# Patient Record
Sex: Male | Born: 1970 | Race: White | Hispanic: No | Marital: Married
Health system: Midwestern US, Community
[De-identification: ages and names within clinical notes are randomized; demographics above are authoritative.]

---

## 2018-07-03 ENCOUNTER — Emergency Department: Admit: 2018-07-03 | Primary: Internal Medicine

## 2018-07-03 ENCOUNTER — Inpatient Hospital Stay: Admit: 2018-07-03 | Discharge: 2018-07-03 | Disposition: A | Discharge status: Home / Self Care

## 2018-07-03 DIAGNOSIS — J90 Pleural effusion, not elsewhere classified: Secondary | ICD-10-CM

## 2018-07-03 LAB — CBC WITH AUTO DIFFERENTIAL
Basophils %: 0.8 %
Basophils Absolute: 0.1 10*3/uL (ref 0.0–0.2)
Eosinophils %: 5.9 %
Eosinophils Absolute: 0.9 10*3/uL — ABNORMAL HIGH (ref 0.0–0.7)
Hematocrit: 38.7 % — ABNORMAL LOW (ref 42.0–52.0)
Hemoglobin: 12.7 g/dL — ABNORMAL LOW (ref 14.0–18.0)
Lymphocytes %: 18.8 %
Lymphocytes Absolute: 2.7 10*3/uL (ref 1.0–4.8)
MCH: 29.3 pg (ref 27.0–31.3)
MCHC: 32.8 % — ABNORMAL LOW (ref 33.0–37.0)
MCV: 89.3 fL (ref 80.0–100.0)
Monocytes %: 9.1 %
Monocytes Absolute: 1.3 10*3/uL — ABNORMAL HIGH (ref 0.2–0.8)
Neutrophils %: 65.4 %
Neutrophils Absolute: 9.4 10*3/uL — ABNORMAL HIGH (ref 1.4–6.5)
Platelets: 331 10*3/uL (ref 130–400)
RBC: 4.34 M/uL — ABNORMAL LOW (ref 4.70–6.10)
RDW: 13 % (ref 11.5–14.5)
WBC: 14.4 10*3/uL — ABNORMAL HIGH (ref 4.8–10.8)

## 2018-07-03 LAB — COMPREHENSIVE METABOLIC PANEL
ALT: 9 U/L (ref 0–41)
AST: 12 U/L (ref 0–40)
Albumin: 4 g/dL (ref 3.5–4.6)
Alkaline Phosphatase: 68 U/L (ref 35–104)
Anion Gap: 11 mEq/L (ref 9–15)
BUN: 18 mg/dL (ref 6–20)
CO2: 26 mEq/L (ref 20–31)
Calcium: 9.5 mg/dL (ref 8.5–9.9)
Chloride: 102 mEq/L (ref 95–107)
Creatinine: 0.69 mg/dL — ABNORMAL LOW (ref 0.70–1.20)
GFR African American: >60 (ref >60)
GFR Non-African American: >60 (ref >60)
Globulin: 3 g/dL (ref 2.3–3.5)
Glucose: 101 mg/dL — ABNORMAL HIGH (ref 70–99)
Potassium: 4.6 mEq/L (ref 3.4–4.9)
Sodium: 139 mEq/L (ref 135–144)
Total Bilirubin: 0.3 mg/dL (ref 0.2–0.7)
Total Protein: 7 g/dL (ref 6.3–8.0)

## 2018-07-03 LAB — EKG 12-LEAD
Atrial Rate: 72 {beats}/min
P Axis: 2 degrees
P-R Interval: 172 ms
Q-T Interval: 368 ms
QRS Duration: 86 ms
QTc Calculation (Bazett): 402 ms
R Axis: 15 degrees
T Axis: 16 degrees
Ventricular Rate: 72 {beats}/min

## 2018-07-03 LAB — URINE DRUG SCREEN
Amphetamine Screen, Urine: NEGATIVE (ref <1000)
Barbiturate Screen, Ur: NEGATIVE (ref <200)
Benzodiazepine Screen, Urine: POSITIVE — AB (ref <200)
Cannabinoid Scrn, Ur: NEGATIVE (ref <50)
Cocaine Metabolite Screen, Urine: POSITIVE — AB (ref <300)
Methadone Screen, Urine: NEGATIVE (ref <300)
Opiate Scrn, Ur: POSITIVE — AB (ref <300)
Oxycodone Urine: NEGATIVE (ref <100)
PCP Screen, Urine: NEGATIVE (ref <25)
Propoxyphene Scrn, Ur: NEGATIVE (ref <300)

## 2018-07-03 LAB — COVID-19: SARS-CoV-2, NAAT: NOT DETECTED

## 2018-07-03 LAB — POCT CREATININE: POC CREATININE WHOLE BLOOD: 0.7

## 2018-07-03 LAB — ETHANOL: Ethanol Lvl: <10 mg/dL

## 2018-07-03 LAB — TROPONIN: Troponin: <0.01 ng/mL (ref 0.000–0.010)

## 2018-07-03 MED ORDER — DEXTROSE 5 % IV SOLN ADMIXTURE
5 % | Freq: Once | INTRAVENOUS | Status: AC
Start: 2018-07-03 — End: 2018-07-03
  Administered 2018-07-03: 21:00:00 1 g via INTRAVENOUS

## 2018-07-03 MED ORDER — IOPAMIDOL 61 % IV SOLN
61 % | Freq: Once | INTRAVENOUS | Status: AC | PRN
Start: 2018-07-03 — End: 2018-07-03
  Administered 2018-07-03: 18:00:00 100 mL via INTRAVENOUS

## 2018-07-03 MED ORDER — LEVOFLOXACIN 750 MG PO TABS
750 MG | ORAL_TABLET | Freq: Every day | ORAL | 0 refills | Status: AC
Start: 2018-07-03 — End: 2018-07-08

## 2018-07-03 MED ORDER — SODIUM CHLORIDE 0.9 % IV BOLUS
0.9 | Freq: Once | INTRAVENOUS | Status: AC
Start: 2018-07-03 — End: 2018-07-03
  Administered 2018-07-03: 17:00:00 1000 mL via INTRAVENOUS

## 2018-07-03 MED ORDER — DEXTROSE 5 % IV SOLN (MINI-BAG)
5 % | Freq: Once | INTRAVENOUS | Status: DC
Start: 2018-07-03 — End: 2018-07-03

## 2018-07-03 MED FILL — CEFTRIAXONE SODIUM 1 G IJ SOLR: 1 g | INTRAMUSCULAR | Qty: 1

## 2018-07-03 MED FILL — SODIUM CHLORIDE 0.9 % IV SOLN: 0.9 % | INTRAVENOUS | Qty: 1000

## 2018-07-03 MED FILL — AZITHROMYCIN 500 MG IV SOLR: 500 MG | INTRAVENOUS | Qty: 500

## 2018-07-03 NOTE — ED Notes (Signed)
Pt not wanting to stay for admission.  Risks associated with leaving AMA discussed with pt in depth.  Pt understanding and stated once he gets some things done around the house he will be back for admission.  Pt did not want to stay for zithromax administration.  Providers aware.  Discharge instructions reviewed and signed.  AMA paper signed.  Prescription given to pt.  No questions at this time.  Pt ambulatory in stable condition at discharge.     Rushie Nyhan, RN  07/03/18 1701

## 2018-07-03 NOTE — ED Provider Notes (Signed)
[facility]  eMERGENCY dEPARTMENTeNCOUnter      Pt Name: Frank Lawrence  MRN: [id number]  Birthdate August 09, 1970  Date ofevaluation: 07/03/2018  Provider: Aldean AstHope B Morrisa Aldaba, PA-C    CHIEF COMPLAINT       Chief Complaint   Patient presents with   ??? Chest Pain     pt c/o chest pain that radiates into his back, Pt states the pain has increased over the past 2 days         HISTORY OF PRESENT ILLNESS   (Location/Symptom, Timing/Onset,Context/Setting, Quality, Duration, Modifying Factors, Severity)  Note limiting factors.   Frank Lawrence is a 48 y.o. male who presents to the emergency department right sided chest pain that has been intermittent over the last month but worsening over the last 2 days.  The pain will last about a minute.  Pain is worse with inspiration.  He has not felt ill with fevers chills nausea vomiting cough and the last few days.  No known exposure to COVID.  He is a smoker he does have a history of hypertension hyperlipidemia.  Patient denies any drugs or alcohol use.  Denies IV drug use.    HPI    NursingNotes were reviewed.    REVIEW OF SYSTEMS    (2-9 systems for level 4, 10 or more for level 5)     Review of Systems   Constitutional: Negative for chills, diaphoresis, fatigue and fever.   HENT: Negative for congestion, rhinorrhea and sore throat.    Eyes: Negative for photophobia and pain.   Respiratory: Negative for cough and shortness of breath.    Cardiovascular: Positive for chest pain. Negative for palpitations.   Gastrointestinal: Negative for abdominal pain, diarrhea, nausea and vomiting.   Genitourinary: Negative for dysuria and flank pain.   Musculoskeletal: Negative for back pain.   Skin: Negative for rash.   Neurological: Negative for dizziness, light-headedness and headaches.   Psychiatric/Behavioral: Negative.    All other systems reviewed and are negative.      Except as noted above the remainder of the review of systems was reviewed and negative.       PAST MEDICAL  HISTORY     Past Medical History:   Diagnosis Date   ??? Hypertension          SURGICALHISTORY     History reviewed. No pertinent surgical history.      CURRENT MEDICATIONS       Previous Medications    No medications on file       ALLERGIES     Patient has no known allergies.    FAMILY HISTORY     History reviewed. No pertinent family history.       SOCIAL HISTORY       Social History     Socioeconomic History   ??? Marital status: Married     Spouse name: None   ??? Number of children: None   ??? Years of education: None   ??? Highest education level: None   Occupational History   ??? None   Social Needs   ??? Financial resource strain: None   ??? Food insecurity     Worry: None     Inability: None   ??? Transportation needs     Medical: None     Non-medical: None   Tobacco Use   ??? Smoking status: Current Every Day Smoker   ??? Smokeless tobacco: Never Used   Substance and Sexual Activity   ???  Alcohol use: Not Currently   ??? Drug use: Yes     Types: Opiates    ??? Sexual activity: None   Lifestyle   ??? Physical activity     Days per week: None     Minutes per session: None   ??? Stress: None   Relationships   ??? Social Wellsite geologistconnections     Talks on phone: None     Gets together: None     Attends religious service: None     Active member of club or organization: None     Attends meetings of clubs or organizations: None     Relationship status: None   ??? Intimate partner violence     Fear of current or ex partner: None     Emotionally abused: None     Physically abused: None     Forced sexual activity: None   Other Topics Concern   ??? None   Social History Narrative   ??? None       SCREENINGS    Glasgow Coma Scale  Eye Opening: Spontaneous  Best Verbal Response: Oriented  Best Motor Response: Obeys commands  Glasgow Coma Scale Score: 15 @FLOW ([id number])@(16032801)@      PHYSICAL EXAM    (up to 7 for level 4, 8 or more for level 5)     ED Triage Vitals [07/03/18 1219]   BP Temp Temp Source Pulse Resp SpO2 Height Weight   122/77 98.6 ??F (37 ??C) Oral 75 16 93 % 6'  3" (1.905 m) 260 lb (117.9 kg)       Physical Exam  Vitals signs and nursing note reviewed.   Constitutional:       General: He is not in acute distress.     Appearance: Normal appearance. He is well-developed. He is not diaphoretic.   HENT:      Head: Normocephalic and atraumatic.      Right Ear: Tympanic membrane, ear canal and external ear normal.      Left Ear: Tympanic membrane, ear canal and external ear normal.      Nose: Nose normal.   Eyes:      General: Lids are normal.      Conjunctiva/sclera: Conjunctivae normal.      Pupils: Pupils are equal, round, and reactive to light.   Neck:      Musculoskeletal: Normal range of motion and neck supple.   Cardiovascular:      Rate and Rhythm: Normal rate and regular rhythm.      Pulses: Normal pulses.      Heart sounds: Normal heart sounds.   Pulmonary:      Effort: Pulmonary effort is normal.      Breath sounds: Normal breath sounds.   Abdominal:      General: Bowel sounds are normal.      Palpations: Abdomen is soft.      Tenderness: There is no abdominal tenderness.   Lymphadenopathy:      Cervical: No cervical adenopathy.   Skin:     General: Skin is warm and dry.      Capillary Refill: Capillary refill takes less than 2 seconds.      Findings: No rash.   Neurological:      Mental Status: He is alert and oriented to person, place, and time.   Psychiatric:         Thought Content: Thought content normal.         Judgment: Judgment normal.  DIAGNOSTIC RESULTS     EKG: All EKG's are interpreted by the Emergency Department Physician who either signs or Co-signsthis chart in the absence of a cardiologist.    nsr 72bpm no acute sht changes     RADIOLOGY:   Non-plain filmimages such as CT, Ultrasound and MRI are read by the radiologist. Plain radiographic images are visualized and preliminarily interpreted by the emergency physician with the below findings:        Interpretation per the Radiologist below, if available at the time ofthis note:    CTA Chest W WO   (PE study)   Final Result   1. No CT evidence of pulmonary embolism to the segmental level. More distal pulmonary emboli are not excluded. Abnormal density noted in the right hilar region surrounding the vessels, findings are suspected to reflect proliferation of lymphoid    tissue/lymph nodes, I cannot exclude an infiltrative mass or malignancy. PET/CT is recommended.   2. There is a right pleural effusion with associated airspace disease in the right lung base that is likely reflective of a combination of infiltrate/pneumonia and atelectasis.   3. Pulmonary nodules are noted in the right lung, on series 3 image 42 series 3 image 34, measuring between 0.67 0.73 cm, nonspecific finding, findings can be seen in chronic pulmonary nodule formation or metastatic disease/malignancy.   4. Incomplete inclusion on the scan of a nodular density in the pericaval region is suspicious for abnormally enlarged lymphadenopathy measuring 1.5 x 1.1 cm.   5. Age-indeterminate anterior wedging/compression fracture deformities T10-T12 vertebral bodies.      XR CHEST PORTABLE   Final Result   Impression:     1. Nonspecific airspace disease right upper lung and right lower lung base. No comparisons are available. Findings could reflect infiltrate/pneumonia. I cannot exclude CoVid-19 viral infection and/or pulmonary involvement based on this study.. Other    etiologies are not excluded.               ED BEDSIDE ULTRASOUND:   Performed by ED Physician - none    LABS:  Labs Reviewed   COMPREHENSIVE METABOLIC PANEL - Abnormal; Notable for the following components:       Result Value    Glucose 101 (*)     CREATININE 0.69 (*)     All other components within normal limits   URINE DRUG SCREEN - Abnormal; Notable for the following components:    Benzodiazepine Screen, Urine POSITIVE (*)     Cocaine Metabolite Screen, Urine POSITIVE (*)     Opiate Scrn, Ur POSITIVE (*)     All other components within normal limits   CBC WITH AUTO DIFFERENTIAL -  Abnormal; Notable for the following components:    WBC 14.4 (*)     RBC 4.34 (*)     Hemoglobin 12.7 (*)     Hematocrit 38.7 (*)     MCHC 32.8 (*)     Neutrophils Absolute 9.4 (*)     Monocytes Absolute 1.3 (*)     Eosinophils Absolute 0.9 (*)     All other components within normal limits   POCT CREATININE - URINE - Normal   ETHANOL   TROPONIN   COVID-19       All other labs were within normal range or not returned as of this dictation.    EMERGENCY DEPARTMENT COURSE and DIFFERENTIAL DIAGNOSIS/MDM:   Vitals:    Vitals:    07/03/18 1333 07/03/18 1435 07/03/18 1507 07/03/18 1527  BP: 114/67 97/68 109/62 109/62   Pulse: 65 59 61 57   Resp: 16 16 16 16    Temp:       TempSrc:       SpO2: 95% 97% 94% 95%   Weight:       Height:               MDM    Patient presents with chest pain with inspiration.  Patient is nontoxic no acute distress and he reports minimal to no pain at this time.  It has been intermittent over the last month.  He is a smoker.  Labs are remarkable for a leukocytosis.  CT of the chest was performed to rule out a PE.  There is been concern for possible malignancy as well as there is a pleural effusion.  Infective etiology can also not be ruled out.  Recommendation is for admission to the hospital for further treatment and evaluation of this new diagnosis.  Patient was started on IV antibiotics while in the emergency department for possible infiltrate infectious etiology.  However I have a lower suspicion for infection versus a malignancy at this time.  I discussed the CAT scan results with the patient.  He is aware of a mass in his chest that could be an infection but also could be an aggressive malignancy.  Patient is adamant on not being admitted to the hospital as it is not a good time for him or his family.  I stressed the severity of his condition and the need for more imaging and to to see pulmonary and oncology immediately and this could best be served admitted to the hospital.  Patient  understands he can have a life-threatening illness that could lead to permanent disability or even death.  Patient can still not be convinced to stay in the hospital.  Patient was provided with multiple specialists including oncology, pulmonary a local family physician.  He was instructed to contact these providers in 1 day.  Patient was sent home with Levaquin for possible infection.  He is instructed to please return at anytime to the emergency department for further evaluation and treatment.  This case was discussed with Dr. Phineas Douglas who also recommended admission to the hospital.  Patient was made made aware that attending physician also recommends admission to the hospital and patient still refuses admission.  Patient is willing to sign out [place]. Dr. Phineas Douglas spend 92minutes also discussing pts AMA status with risks. He explained to please come back at anytime to be admitted as well.     REASSESSMENT          CRITICAL CARE TIME   Total Critical Care time was  minutes, excluding separatelyreportable procedures.  There was a high probability ofclinically significant/life threatening deterioration in the patient's condition which required my urgent intervention.      CONSULTS:  None    PROCEDURES:  Unless otherwise noted below, none     Procedures    FINAL IMPRESSION      1. Lung mass    2. Pleural effusion    3. Leukocytosis, unspecified type    4. Polysubstance abuse [facility])          DISPOSITION/PLAN   DISPOSITION Ama 07/03/2018 04:19:08 PM      PATIENT REFERREDTO:  [place] [address]  [phone]    Schedule an appointment as soon as possible for a visit in 2 days  Ramond CraverJay E Sidloski, DO  [address]  [place] [id number]  [phone]    Schedule an appointment as soon as possible for a visit in 1 day      Hillery JacksAshok P Makadia, MD  [address]  [address]  [place] [id number]  [phone]    Schedule an appointment as soon as possible for a visit in 1 day      Trenton Gammonobert L  Thomas, MD  [address]  [place] [id number]  [phone]    Schedule an appointment as soon as possible for a visit in 1 day        DISCHARGEMEDICATIONS:  New Prescriptions    LEVOFLOXACIN (LEVAQUIN) 750 MG TABLET    Take 1 tablet by mouth daily for 5 days          (Please note that portions of this note were completed with a voice recognition program.  Efforts were made to edit the dictations but occasionally words are mis-transcribed.)    Aldean AstHope B Jennfier Abdulla, PA-C (electronically signed)  Attending Emergency Physician         Aldean AstHope B Andriea Hasegawa, PA-C  07/03/18 [address]       Jaleyah Longhi B ParrottMurphy, [place] JerseyPA-C  07/03/18 1659

## 2018-07-03 NOTE — ED Notes (Signed)
COVID swab labeled and sent to lab.     Rushie Nyhan, RN  07/03/18 1435

## 2018-07-03 NOTE — ED Notes (Signed)
Urine labeled and sent to lab.     Rushie Nyhan, RN  07/03/18 1256

## 2018-07-03 NOTE — ED Triage Notes (Signed)
Pt c/o chest pain that radiates into his back and increases with inhalation for the past month, Pt states the pain increased today, Pt is A&OX3, calm, ambulatory, afebile, breathes are equal and unlabored,

## 2018-07-03 NOTE — ED Notes (Signed)
XRay at bedside.     Rushie Nyhan, RN  07/03/18 1304

## 2018-07-03 NOTE — ED Notes (Signed)
Blood labeled and sent to lab.     Rushie Nyhan, RN  07/03/18 1246

## 2018-07-03 NOTE — ED Notes (Signed)
EKG done and given to [facility].  Pt placed on continuous cardiac monitor upon arrival to ER.  Tele strip printed and mounted.     Rushie Nyhan, RN  07/03/18 1235

## 2018-07-03 NOTE — ED Notes (Signed)
Pt to CT via cart.     Rushie Nyhan, RN  07/03/18 1349

## 2018-07-04 ENCOUNTER — Inpatient Hospital Stay
Admit: 2018-07-04 | Discharge: 2018-07-06 | Discharge status: Against Medical Advice | Source: Ambulatory Visit | Admitting: Internal Medicine

## 2018-07-04 DIAGNOSIS — J9601 Acute respiratory failure with hypoxia: Principal | ICD-10-CM

## 2018-07-04 LAB — CBC WITH AUTO DIFFERENTIAL
Basophils %: 0.4 %
Basophils Absolute: 0.1 10*3/uL (ref 0.0–0.2)
Eosinophils %: 0.9 %
Eosinophils Absolute: 0.2 10*3/uL (ref 0.0–0.7)
Hematocrit: 41 % — ABNORMAL LOW (ref 42.0–52.0)
Hemoglobin: 13.7 g/dL — ABNORMAL LOW (ref 14.0–18.0)
Lymphocytes %: 7.5 %
Lymphocytes Absolute: 1.3 10*3/uL (ref 1.0–4.8)
MCH: 29.8 pg (ref 27.0–31.3)
MCHC: 33.4 % (ref 33.0–37.0)
MCV: 89.4 fL (ref 80.0–100.0)
Monocytes %: 9.7 %
Monocytes Absolute: 1.7 10*3/uL — ABNORMAL HIGH (ref 0.2–0.8)
Neutrophils %: 81.5 %
Neutrophils Absolute: 14.3 10*3/uL — ABNORMAL HIGH (ref 1.4–6.5)
Platelets: 311 10*3/uL (ref 130–400)
RBC: 4.59 M/uL — ABNORMAL LOW (ref 4.70–6.10)
RDW: 13.2 % (ref 11.5–14.5)
WBC: 17.6 10*3/uL — ABNORMAL HIGH (ref 4.8–10.8)

## 2018-07-04 LAB — COMPREHENSIVE METABOLIC PANEL
ALT: 8 U/L (ref 0–41)
AST: 10 U/L (ref 0–40)
Albumin: 4 g/dL (ref 3.5–4.6)
Alkaline Phosphatase: 63 U/L (ref 35–104)
Anion Gap: 8 mEq/L — ABNORMAL LOW (ref 9–15)
BUN: 9 mg/dL (ref 6–20)
CO2: 25 mEq/L (ref 20–31)
Calcium: 9.7 mg/dL (ref 8.5–9.9)
Chloride: 101 mEq/L (ref 95–107)
Creatinine: 0.59 mg/dL — ABNORMAL LOW (ref 0.70–1.20)
GFR African American: >60 (ref >60)
GFR Non-African American: >60 (ref >60)
Globulin: 3.6 g/dL — ABNORMAL HIGH (ref 2.3–3.5)
Glucose: 118 mg/dL — ABNORMAL HIGH (ref 70–99)
Potassium: 4.1 mEq/L (ref 3.4–4.9)
Sodium: 134 mEq/L — ABNORMAL LOW (ref 135–144)
Total Bilirubin: 0.7 mg/dL (ref 0.2–0.7)
Total Protein: 7.6 g/dL (ref 6.3–8.0)

## 2018-07-04 LAB — EKG 12-LEAD
Atrial Rate: 89 {beats}/min
P Axis: -2 degrees
P-R Interval: 168 ms
Q-T Interval: 342 ms
QRS Duration: 92 ms
QTc Calculation (Bazett): 416 ms
R Axis: 4 degrees
T Axis: 7 degrees
Ventricular Rate: 89 {beats}/min

## 2018-07-04 LAB — POCT ARTERIAL
Base Excess, Arterial: 3 (ref <=3)
Calcium, Ionized: 1.17 mmol/L (ref 1.12–1.32)
FIO2: 21
GFR African American: >60 (ref >60)
GFR Non-African American: >60 (ref >60)
HCO3, Arterial: 26.5 mmol/L (ref 21.0–29.0)
Hemoglobin: 13.4 gm/dL — ABNORMAL LOW (ref 13.5–17.5)
Lactate: <0.3 mmol/L — ABNORMAL LOW (ref 0.40–2.00)
O2 Sat, Arterial: 91 % (ref 93–100)
POC Chloride: 102 mEq/L (ref 99–110)
POC Creatinine: 0.7 mg/dL — ABNORMAL LOW (ref 0.9–1.3)
POC Glucose: 110 mg/dl (ref 60–115)
POC Hematocrit: 39 % — ABNORMAL LOW (ref 41–53)
POC Potassium: 4.1 mEq/L (ref 3.5–5.1)
POC Sodium: 138 mEq/L (ref 136–145)
TCO2, Arterial: 28 (ref 22–29)
pCO2, Arterial: 36 mm Hg (ref 35–45)
pH, Arterial: 7.478 — ABNORMAL HIGH (ref 7.350–7.450)
pO2, Arterial: 55 mm Hg (ref 75–108)

## 2018-07-04 LAB — LACTIC ACID: Lactic Acid: 0.7 mmol/L (ref 0.5–2.2)

## 2018-07-04 LAB — COVID-19: SARS-CoV-2, NAAT: NOT DETECTED

## 2018-07-04 LAB — PROCALCITONIN: Procalcitonin: 0.08 ng/mL (ref 0.00–0.15)

## 2018-07-04 MED ORDER — ONDANSETRON HCL 4 MG/2ML IJ SOLN
4 MG/2ML | Freq: Once | INTRAMUSCULAR | Status: AC
Start: 2018-07-04 — End: 2018-07-04
  Administered 2018-07-04: 23:00:00 4 mg via INTRAVENOUS

## 2018-07-04 MED ORDER — DEXTROSE 5 % IV SOLN (MINI-BAG)
5 % | Freq: Once | INTRAVENOUS | Status: AC
Start: 2018-07-04 — End: 2018-07-04
  Administered 2018-07-04: 23:00:00 1 g via INTRAVENOUS

## 2018-07-04 MED ORDER — DEXTROSE 5 % IV SOLN (MINI-BAG)
5 % | Freq: Once | INTRAVENOUS | Status: AC
Start: 2018-07-04 — End: 2018-07-04
  Administered 2018-07-04: 23:00:00 500 mg via INTRAVENOUS

## 2018-07-04 MED ORDER — MORPHINE SULFATE (PF) 2 MG/ML IV SOLN
2 MG/ML | Freq: Once | INTRAVENOUS | Status: AC
Start: 2018-07-04 — End: 2018-07-04
  Administered 2018-07-04: 23:00:00 4 mg via INTRAVENOUS

## 2018-07-04 MED ORDER — KETOROLAC TROMETHAMINE 30 MG/ML IJ SOLN
30 MG/ML | Freq: Once | INTRAMUSCULAR | Status: AC
Start: 2018-07-04 — End: 2018-07-04
  Administered 2018-07-04: 23:00:00 30 mg via INTRAVENOUS

## 2018-07-04 MED ORDER — SODIUM CHLORIDE 0.9 % IV BOLUS
0.9 | Freq: Once | INTRAVENOUS | Status: AC
Start: 2018-07-04 — End: 2018-07-04
  Administered 2018-07-04: 23:00:00 1000 mL via INTRAVENOUS

## 2018-07-04 MED FILL — CEFTRIAXONE SODIUM 1 G IJ SOLR: 1 g | INTRAMUSCULAR | Qty: 1

## 2018-07-04 MED FILL — AZITHROMYCIN 500 MG IV SOLR: 500 MG | INTRAVENOUS | Qty: 500

## 2018-07-04 MED FILL — SODIUM CHLORIDE 0.9 % IV SOLN: 0.9 % | INTRAVENOUS | Qty: 1000

## 2018-07-04 MED FILL — ONDANSETRON HCL 4 MG/2ML IJ SOLN: 4 MG/2ML | INTRAMUSCULAR | Qty: 2

## 2018-07-04 MED FILL — KETOROLAC TROMETHAMINE 30 MG/ML IJ SOLN: 30 MG/ML | INTRAMUSCULAR | Qty: 1

## 2018-07-04 MED FILL — MORPHINE SULFATE 2 MG/ML IJ SOLN: 2 mg/mL | INTRAMUSCULAR | Qty: 2

## 2018-07-04 NOTE — Care Coordination-Inpatient (Signed)
[facility] Case Management Initial Discharge Assessment    Met with Patient to discuss discharge plan.        PCP: Ferd Hibbs                                Date of Last Visit: 6 months    If no PCP, list provided? N/A    Discharge Planning    Living Arrangements: independently at home    Who do you live with? wife    Who helps you with your care:  self    If lives at home:     Do you have any barriers navigating in your home? no    Patient can perform ADL?  Yes    Current Services (outpatient and in home) :  None    Dialysis: No    Is transportation available to get to your appointments? Yes    DME Equipment:  no    Respiratory equipment: None    Respiratory provider:  no     Pharmacy:  yes - [facility]    Consult with Medication Assistance Program?  No        Does Patient Have a High-Risk for Readmission Diagnosis (CHF, PN, MI, COPD)? No.      Initial Discharge Plan? (Note: please see concurrent daily documentation for any updates after initial note).      CM to assess for any further d/c needs or referrals.    Electronically signed by Kern Alberta on 07/04/2018 at 8:25 PM

## 2018-07-04 NOTE — ED Notes (Signed)
Taking over the care of pt.     Vincenza Hews, RN  07/04/18 1940

## 2018-07-04 NOTE — ED Notes (Signed)
Bed: 20  Expected date: 07/04/18  Expected time: 6:10 PM  Means of arrival: Ambulance  Comments:  62 M - CP. Mass on Rt side of chest. Seen yesterday. 654/76,87,76     Eddie Candle, RN  07/04/18 1819

## 2018-07-04 NOTE — ED Notes (Signed)
Tele pack on.     Mindi Junker, RN  07/04/18 2006

## 2018-07-04 NOTE — ED Notes (Signed)
Pt crying out in pain with movement, deep breaths, coughing, or sneezing.  Pt aware of ambulatory pulse ox; pt does not think he will be able to.  Theodoro Grist PA aware.     Rushie Nyhan, RN  07/04/18 [phone]

## 2018-07-04 NOTE — ED Triage Notes (Addendum)
Pt here via EMS for complaints of shortness of breath.  He was seen in ER yesterday and signed out AMA for admission.  Pt returns for worsening shortness of breath.

## 2018-07-04 NOTE — ED Provider Notes (Signed)
[facility]  eMERGENCY dEPARTMENTeNCOUnter      Pt Name: Frank Lawrence  MRN: [id number]  Frank Lawrence 1970-04-25  Date ofevaluation: 07/04/2018  Provider: Angelene Giovanni, PA-C    CHIEF COMPLAINT       Chief Complaint   Patient presents with   ??? Shortness of Breath         HISTORY OF PRESENT ILLNESS   (Location/Symptom, Timing/Onset,Context/Setting, Quality, Duration, Modifying Factors, Severity)  Note limiting factors.   Frank Lawrence is a 48 y.o. male who presents to the emergency department with a one-month history of pleuritic chest pain and shortness of breath that has worsened in severity over the past 2 days.  Patient was seen in the emergency department for the same complaints yesterday and diagnosed with new lung mass and pleural effusion.  Patient was to be admitted yesterday but the patient signed out [place].  Patient states that the pain is even worse than his visit yesterday and he is becoming more more short of breath.  Patient denies any abdominal pain, vomiting or diarrhea.  Patient does state that he has been having a mild cough but he states that "it hurts too much to cough so I do not".  Patient complains of some chills, malaise and generalized fatigue but denies any sore throats or congestion.    HPI    NursingNotes were reviewed.    REVIEW OF SYSTEMS    (2-9 systems for level 4, 10 or more for level 5)     Review of Systems   Constitutional: Positive for fatigue. Negative for diaphoresis and fever.   HENT: Negative for hearing loss and trouble swallowing.    Eyes: Negative for pain.   Respiratory: Positive for cough and shortness of breath. Negative for apnea.    Cardiovascular: Positive for chest pain.   Gastrointestinal: Negative for abdominal pain.   Endocrine: Negative.    Genitourinary: Negative for hematuria.   Musculoskeletal: Negative for neck pain and neck stiffness.   Skin: Negative for color change.   Allergic/Immunologic: Negative.    Neurological: Negative for dizziness and  numbness.   Hematological: Negative.    Psychiatric/Behavioral: Negative.    All other systems reviewed and are negative.      Except as noted above the remainder of the review of systems was reviewed and negative.       PAST MEDICAL HISTORY     Past Medical History:   Diagnosis Date   ??? Hypertension          SURGICALHISTORY     No past surgical history on file.      CURRENT MEDICATIONS       Previous Medications    LEVOFLOXACIN (LEVAQUIN) 750 MG TABLET    Take 1 tablet by mouth daily for 5 days       ALLERGIES     Patient has no known allergies.    FAMILY HISTORY     No family history on file.       SOCIAL HISTORY       Social History     Socioeconomic History   ??? Marital status: Married     Spouse name: Not on file   ??? Number of children: Not on file   ??? Years of education: Not on file   ??? Highest education level: Not on file   Occupational History   ??? Not on file   Social Needs   ??? Financial resource strain: Not on file   ??? Food  insecurity     Worry: Not on file     Inability: Not on file   ??? Transportation needs     Medical: Not on file     Non-medical: Not on file   Tobacco Use   ??? Smoking status: Current Every Day Smoker   ??? Smokeless tobacco: Never Used   Substance and Sexual Activity   ??? Alcohol use: Not Currently   ??? Drug use: Yes     Types: Opiates    ??? Sexual activity: Not on file   Lifestyle   ??? Physical activity     Days per week: Not on file     Minutes per session: Not on file   ??? Stress: Not on file   Relationships   ??? Social Wellsite geologistconnections     Talks on phone: Not on file     Gets together: Not on file     Attends religious service: Not on file     Active member of club or organization: Not on file     Attends meetings of clubs or organizations: Not on file     Relationship status: Not on file   ??? Intimate partner violence     Fear of current or ex partner: Not on file     Emotionally abused: Not on file     Physically abused: Not on file     Forced sexual activity: Not on file   Other Topics Concern    ??? Not on file   Social History Narrative   ??? Not on file       SCREENINGS    Glasgow Coma Scale  Eye Opening: Spontaneous  Best Verbal Response: Oriented  Best Motor Response: Obeys commands  Glasgow Coma Scale Score: 15         PHYSICAL EXAM    (up to 7 for level 4, 8 or more for level 5)     ED Triage Vitals [07/04/18 1817]   BP Temp Temp Source Pulse Resp SpO2 Height Weight   135/82 100 ??F (37.8 ??C) Oral 90 18 92 % 6\' 3"  (1.905 m) 260 lb (117.9 kg)       Physical Exam  Vitals signs and nursing note reviewed.   Constitutional:       General: He is not in acute distress.     Appearance: He is well-developed. He is ill-appearing. He is not diaphoretic.   HENT:      Head: Normocephalic and atraumatic.      Mouth/Throat:      Pharynx: No oropharyngeal exudate.   Eyes:      General: No scleral icterus.     Conjunctiva/sclera: Conjunctivae normal.      Pupils: Pupils are equal, round, and reactive to light.   Neck:      Musculoskeletal: Normal range of motion and neck supple.      Trachea: No tracheal deviation.   Cardiovascular:      Rate and Rhythm: Normal rate.      Heart sounds: Normal heart sounds.   Pulmonary:      Effort: Pulmonary effort is normal. No respiratory distress.      Breath sounds: Normal breath sounds.   Abdominal:      General: Bowel sounds are normal. There is no distension.      Palpations: Abdomen is soft.   Musculoskeletal: Normal range of motion.   Skin:     General: Skin is warm and dry.      Findings:  No erythema or rash.   Neurological:      Mental Status: He is alert and oriented to person, place, and time.      Cranial Nerves: No cranial nerve deficit.      Motor: No abnormal muscle tone.   Psychiatric:         Behavior: Behavior normal.         Thought Content: Thought content normal.         Judgment: Judgment normal.         DIAGNOSTIC RESULTS     EKG: All EKG's are interpreted by the Emergency Department Physician who either signs or Co-signsthis chart in the absence of a  cardiologist.    Normal sinus rhythm, rate 89 bpm, no acute ST elevation      RADIOLOGY:   Non-plain filmimages such as CT, Ultrasound and MRI are read by the radiologist. Plain radiographic images are visualized and preliminarily interpreted by the emergency physician with the below findings:      Interpretation per the Radiologist below, if available at the time ofthis note:    No orders to display         ED BEDSIDE ULTRASOUND:   Performed by ED Physician - none    LABS:  Labs Reviewed   COMPREHENSIVE METABOLIC PANEL - Abnormal; Notable for the following components:       Result Value    Sodium 134 (*)     Anion Gap 8 (*)     Glucose 118 (*)     CREATININE 0.59 (*)     Globulin 3.6 (*)     All other components within normal limits   CBC WITH AUTO DIFFERENTIAL - Abnormal; Notable for the following components:    WBC 17.6 (*)     RBC 4.59 (*)     Hemoglobin 13.7 (*)     Hematocrit 41.0 (*)     Neutrophils Absolute 14.3 (*)     Monocytes Absolute 1.7 (*)     All other components within normal limits   POCT ARTERIAL - Abnormal; Notable for the following components:    POC Creatinine 0.7 (*)     pH, Arterial 7.478 (*)     pO2, Arterial 55 (*)     O2 Sat, Arterial 91 (*)     Lactate <0.30 (*)     POC Hematocrit 39 (*)     Hemoglobin 13.4 (*)     All other components within normal limits   CULTURE, BLOOD 1   CULTURE, BLOOD 2   LACTIC ACID, PLASMA   PROCALCITONIN   COVID-19   POCT EPOC BLOOD GAS, LACTIC ACID, ICA       All other labs were within normal range or not returned as of this dictation.    EMERGENCY DEPARTMENT COURSE and DIFFERENTIAL DIAGNOSIS/MDM:   Vitals:    Vitals:    07/04/18 1817   BP: 135/82   Pulse: 90   Resp: 18   Temp: 100 ??F (37.8 ??C)   TempSrc: Oral   SpO2: 92%   Weight: 260 lb (117.9 kg)   Height: 6\' 3"  (1.905 m)           MDM      REASSESSMENT      Patient presenting the emergency department with right-sided pleuritic chest pain and shortness of breath.  CT reviewed from yesterday that shows small  right-sided perihilar lung mass with some pleural effusion and a possible infiltrate developing as well.  Given  patient's elevated white blood cell count, fever on exam today he was covered with IV antibiotics.  Patient had a pulse ox of 90% when seated and states that he is unable to get up for an ambulatory pulse ox to assess for further hypoxia due to his shortness of breath.  Patient will be admitted for further evaluation and care    CONSULTS:  None    PROCEDURES:  Unless otherwise noted below, none     Procedures    FINAL IMPRESSION      1. Dyspnea on exertion    2. Hypoxia    3. Lung mass    4. Pleural effusion          DISPOSITION/PLAN   DISPOSITION Admitted 07/04/2018 07:28:03 PM      PATIENT REFERREDTO:  Leandro Reasonerallas J Fleming  [id number]  [place] [id number]  [phone]            DISCHARGEMEDICATIONS:  New Prescriptions    No medications on file     Controlled Substances Monitoring:     No flowsheet data found.    (Please note that portions of this note were completed with a voice recognition program.  Efforts were made to edit the dictations but occasionally words are mis-transcribed.)    Volney Presseravid Jaidin Ugarte, PA-C (electronically signed)  Attending Emergency Physician          Volney Presseravid Caryle Helgeson, PA-C  07/04/18 1940       Volney Presseravid Kaycee Mcgaugh, PA-C  07/04/18 1942

## 2018-07-04 NOTE — ED Notes (Signed)
Blood and COVID swab labeled and sent to lab.  Theodoro Grist PA called resp for treatments.     Rushie Nyhan, RN  07/04/18 [phone]

## 2018-07-04 NOTE — ED Notes (Signed)
Pt a&ox4, skin w/d/pink, pulses palp. msp's intact, calm, cooperative, 0 cough , 0 distress, pt resting in bed with hob up. 0 pain at this time, 0 N&v, pt's pulseox 90% on ra, 2l nc administered, dave pa updated, pt tol. Well.      Vincenza Hews, RN  07/04/18 1942

## 2018-07-04 NOTE — H&P (Signed)
Hospitalist Group   History and Physical      CHIEF COMPLAINT: Shortness of breath    History of Present Illness:  48 y.o. male with a history of hypertension presents with shortness of breath.  Patient mentioned that he has been having shortness of breath for the past month that has been gradually worsening.  For the past few days he has been having increased pain when taking deep breaths.  He has been feeling like he would like to cough but he has not been able to because the pain.  He mentions subjective low-grade fevers.  He denied nausea, vomiting, abdominal pain, lower extremity edema, bowel movement changes, urinary symptoms.  Patient mentioned that she has not been having good appetite for the past 2 weeks.  He also mentioned that he has lost about 30 pounds in the past 2 months.  Patient is smoker and he used to smoke 2-1/2 packs a day for 30 years until last year.  Now he mainly vapes.  Patient lives with his wife and his stepson.  He has been in contact with a lot of people but usually wears mask.  He denied any sick contact as far as he knows.    REVIEW OF SYSTEMS:  Subjective low-grade fevers, no chills, has cp, has sob, no n/v, ha, vision/hearing changes, wt changes, hot/cold flashes, other open skin lesions, diarrhea, constipation, dysuria/hematuria unless noted in HPI. Complete ROS performed with the patient and is otherwise negative.      PMH:  Past Medical History:   Diagnosis Date   ??? Hypertension        Surgical History:  No past surgical history on file.    Medications Prior to Admission:    Prior to Admission medications    Medication Sig Start Date End Date Taking? Authorizing Provider   levoFLOXacin (LEVAQUIN) 750 MG tablet Take 1 tablet by mouth daily for 5 days 07/03/18 07/08/18  Aldean AstHope B Murphy, PA-C       Allergies:    Patient has no known allergies.    Social History:    reports that he has been smoking. He has never used smokeless tobacco. He reports previous alcohol use. He reports  current drug use. Drug: Opiates .    Family History:   No family history on file.  Mother died because of cancer in the stomach    PHYSICAL EXAM:  Vitals:  BP 102/64    Pulse 64    Temp 98.2 ??F (36.8 ??C) (Oral)    Resp 19    Ht 6\' 3"  (1.905 m)    Wt 260 lb (117.9 kg)    SpO2 94%    BMI 32.50 kg/m??   General Appearance: alert and oriented to person, place and time, well developed and well- nourished, in no acute distress  Skin: warm and dry, no rash or erythema  Head: normocephalic and atraumatic  Eyes: pupils equal, round, and reactive to light, extraocular eye movements intact, conjunctivae normal  ENT: tympanic membrane, external ear and ear canal normal bilaterally, nose without deformity, nasal mucosa and turbinates normal without polyps  Neck: supple and non-tender without mass, no thyromegaly or thyroid nodules, no cervical lymphadenopathy  Pulmonary/Chest: Diminished lung sounds bilaterally with mild crackles at the bases  Cardiovascular: normal rate, regular rhythm, normal S1 and S2   Abdomen: soft, non-tender, non-distended, normal bowel sounds, no masses or organomegaly  Extremities: no cyanosis, clubbing or edema  Musculoskeletal: normal range of motion, no joint swelling, deformity  or tenderness  Neurologic: reflexes normal and symmetric, no cranial nerve deficit,coordination and speech normal      LABS:  Recent Labs     07/03/18  1245 07/04/18  1830 07/04/18  1909   NA 139 134*  --    K 4.6 4.1  --    CL 102 101  --    CO2 26 25  --    BUN 18 9  --    CREATININE 0.69* 0.59* 0.7*   GLUCOSE 101* 118*  --    CALCIUM 9.5 9.7  --        Recent Labs     07/03/18  1245 07/04/18  1845 07/04/18  1909   WBC 14.4* 17.6*  --    RBC 4.34* 4.59*  --    HGB 12.7* 13.7* 13.4*   HCT 38.7* 41.0*  --    MCV 89.3 89.4  --    MCH 29.3 29.8  --    MCHC 32.8* 33.4  --    RDW 13.0 13.2  --    PLT 331 311  --        Recent Labs     07/04/18  1909   POCGLU 110       CBC with Differential:    Lab Results   Component Value Date     WBC 17.6 07/04/2018    RBC 4.59 07/04/2018    HGB 13.4 07/04/2018    HCT 41.0 07/04/2018    PLT 311 07/04/2018    MCV 89.4 07/04/2018    MCH 29.8 07/04/2018    MCHC 33.4 07/04/2018    RDW 13.2 07/04/2018    LYMPHOPCT 7.5 07/04/2018    MONOPCT 9.7 07/04/2018    BASOPCT 0.4 07/04/2018    MONOSABS 1.7 07/04/2018    LYMPHSABS 1.3 07/04/2018    EOSABS 0.2 07/04/2018    BASOSABS 0.1 07/04/2018     CMP:    Lab Results   Component Value Date    NA 134 07/04/2018    K 4.1 07/04/2018    CL 101 07/04/2018    CO2 25 07/04/2018    BUN 9 07/04/2018    CREATININE 0.7 07/04/2018    CREATININE 0.59 07/04/2018    GFRAA >60 07/04/2018    LABGLOM >60 07/04/2018    GLUCOSE 118 07/04/2018    PROT 7.6 07/04/2018    LABALBU 4.0 07/04/2018    CALCIUM 9.7 07/04/2018    BILITOT 0.7 07/04/2018    ALKPHOS 63 07/04/2018    AST 10 07/04/2018    ALT 8 07/04/2018       Radiology: No results found.    ASSESSMENT/ PLAN::      Active Problems:    SOB (shortness of breath)  Resolved Problems:    * No resolved hospital problems. *      1. Acute hypoxic respiratory failure   Shortness of breath, hypoxia on presentation   Cough but avoids to cough due to pleuritic chest pain   Subjective fevers, leukocytosis   CT scan showed hilar mass and right lung nodules concerning for malignancy   CT also shows possible pneumonia/pleural effusion   On Rocephin and azithromycin-however, procalcitonin is negative   Will continue antibiotic for now and consult pulmonology   Encourage patient to take deep breaths with incentive spirometry   COVID-19 initially was negative-needs repeated test   2. Hilar mass/ right lung nodules   Pleuritic chest pain and shortness of breath for the past month  Weight loss, low appetite   Heavy smoking history- about 70-pack-year history   CT showed abnormal density in the right hilar region surrounding the vessels findings are suggested to reflect proliferation of lymphoid tissue/lymph nodes but infiltrative mass or malignancy cannot be  excluded.  Pulmonary nodules in the right lung measuring less than 1 cm.   Pulmonology consulted   Patient likely going to need imaging including PET/CT   Also consult oncology  3. Hypertension   Resume home medication after verification  4. Tobacco abuse   70-pack-year history of smoking   Patient currently vaping        Code Status: Full  DVT prophylaxis: Lovenox      Electronically signed by Florence Canner, MD on 07/04/2018 at 9:56 PM      NOTE: This report was transcribed using voice recognition software. Every effort was made to ensure accuracy; however, inadvertent computerized transcription errors may be present.

## 2018-07-04 NOTE — ED Notes (Signed)
EKG done and given to [facility].  Pt placed on continuous cardiac monitor.  Tele strip printed and mounted.     Rushie Nyhan, RN  07/04/18 [phone]

## 2018-07-05 ENCOUNTER — Inpatient Hospital Stay: Primary: Internal Medicine

## 2018-07-05 ENCOUNTER — Inpatient Hospital Stay: Admit: 2018-07-05 | Primary: Internal Medicine

## 2018-07-05 LAB — HIGH SENSITIVITY CRP: CRP High Sensitivity: 291.9 mg/L — ABNORMAL HIGH (ref 0.0–5.0)

## 2018-07-05 LAB — SEDIMENTATION RATE: Sed Rate: 80 mm — ABNORMAL HIGH (ref 0–10)

## 2018-07-05 MED ORDER — ACETAMINOPHEN 650 MG RE SUPP
650 MG | Freq: Four times a day (QID) | RECTAL | Status: DC | PRN
Start: 2018-07-05 — End: 2018-07-05

## 2018-07-05 MED ORDER — ORPHENADRINE CITRATE 30 MG/ML IJ SOLN
30 MG/ML | Freq: Two times a day (BID) | INTRAMUSCULAR | Status: DC
Start: 2018-07-05 — End: 2018-07-05

## 2018-07-05 MED ORDER — IPRATROPIUM-ALBUTEROL 0.5-2.5 (3) MG/3ML IN SOLN
RESPIRATORY_TRACT | Status: DC
Start: 2018-07-05 — End: 2018-07-05

## 2018-07-05 MED ORDER — BARIUM SULFATE 2 % PO SUSP
2 % | Freq: Once | ORAL | Status: DC | PRN
Start: 2018-07-05 — End: 2018-07-05

## 2018-07-05 MED ORDER — MAGNESIUM HYDROXIDE 400 MG/5ML PO SUSP
400 MG/5ML | Freq: Every day | ORAL | Status: DC | PRN
Start: 2018-07-05 — End: 2018-07-05

## 2018-07-05 MED ORDER — IPRATROPIUM-ALBUTEROL 0.5-2.5 (3) MG/3ML IN SOLN
RESPIRATORY_TRACT | Status: DC | PRN
Start: 2018-07-05 — End: 2018-07-05

## 2018-07-05 MED ORDER — AZITHROMYCIN 500 MG IV SOLR
500 MG | INTRAVENOUS | Status: DC
Start: 2018-07-05 — End: 2018-07-05

## 2018-07-05 MED ORDER — MORPHINE SULFATE (PF) 2 MG/ML IV SOLN
2 MG/ML | Freq: Once | INTRAVENOUS | Status: AC
Start: 2018-07-05 — End: 2018-07-05
  Administered 2018-07-05: 09:00:00 1 mg via INTRAVENOUS

## 2018-07-05 MED ORDER — KETOROLAC TROMETHAMINE 30 MG/ML IJ SOLN
30 MG/ML | Freq: Four times a day (QID) | INTRAMUSCULAR | Status: DC
Start: 2018-07-05 — End: 2018-07-05

## 2018-07-05 MED ORDER — SENNOSIDES-DOCUSATE SODIUM 8.6-50 MG PO TABS
Freq: Every evening | ORAL | Status: DC
Start: 2018-07-05 — End: 2018-07-05

## 2018-07-05 MED ORDER — KETOROLAC TROMETHAMINE 15 MG/ML IJ SOLN
15 MG/ML | Freq: Four times a day (QID) | INTRAMUSCULAR | Status: DC | PRN
Start: 2018-07-05 — End: 2018-07-04

## 2018-07-05 MED ORDER — TRAMADOL HCL 50 MG PO TABS
50 MG | Freq: Four times a day (QID) | ORAL | Status: DC | PRN
Start: 2018-07-05 — End: 2018-07-05

## 2018-07-05 MED ORDER — ACETAMINOPHEN 325 MG PO TABS
325 MG | Freq: Four times a day (QID) | ORAL | Status: DC | PRN
Start: 2018-07-05 — End: 2018-07-05

## 2018-07-05 MED ORDER — HYDROMORPHONE HCL 1 MG/ML IJ SOLN
1 MG/ML | INTRAMUSCULAR | Status: DC | PRN
Start: 2018-07-05 — End: 2018-07-05
  Administered 2018-07-05 (×2): 1 mg via INTRAVENOUS

## 2018-07-05 MED ORDER — NORMAL SALINE FLUSH 0.9 % IV SOLN
0.9 % | INTRAVENOUS | Status: DC | PRN
Start: 2018-07-05 — End: 2018-07-05

## 2018-07-05 MED ORDER — LIDOCAINE 4 % EX PTCH
4 % | Freq: Every day | CUTANEOUS | Status: DC
Start: 2018-07-05 — End: 2018-07-05

## 2018-07-05 MED ORDER — ONDANSETRON HCL 4 MG/2ML IJ SOLN
4 MG/2ML | Freq: Four times a day (QID) | INTRAMUSCULAR | Status: DC | PRN
Start: 2018-07-05 — End: 2018-07-05

## 2018-07-05 MED ORDER — GABAPENTIN 300 MG PO CAPS
300 MG | Freq: Three times a day (TID) | ORAL | Status: DC
Start: 2018-07-05 — End: 2018-07-05

## 2018-07-05 MED ORDER — DEXTROSE 5 % IV SOLN (MINI-BAG)
5 % | INTRAVENOUS | Status: DC
Start: 2018-07-05 — End: 2018-07-05

## 2018-07-05 MED ORDER — POLYETHYLENE GLYCOL 3350 17 G PO PACK
17 g | Freq: Every day | ORAL | Status: DC | PRN
Start: 2018-07-05 — End: 2018-07-05

## 2018-07-05 MED ORDER — NORMAL SALINE FLUSH 0.9 % IV SOLN
0.9 | Freq: Two times a day (BID) | INTRAVENOUS | Status: DC
Start: 2018-07-05 — End: 2018-07-05
  Administered 2018-07-05: 03:00:00 10 mL via INTRAVENOUS

## 2018-07-05 MED ORDER — IOPAMIDOL 61 % IV SOLN
61 % | Freq: Once | INTRAVENOUS | Status: AC | PRN
Start: 2018-07-05 — End: 2018-07-05
  Administered 2018-07-05: 20:00:00 100 mL via INTRAVENOUS

## 2018-07-05 MED ORDER — KETOROLAC TROMETHAMINE 30 MG/ML IJ SOLN
30 MG/ML | Freq: Four times a day (QID) | INTRAMUSCULAR | Status: DC | PRN
Start: 2018-07-05 — End: 2018-07-05
  Administered 2018-07-05 (×3): 15 mg via INTRAVENOUS

## 2018-07-05 MED ORDER — HYDROMORPHONE HCL 1 MG/ML IJ SOLN
1 MG/ML | INTRAMUSCULAR | Status: DC | PRN
Start: 2018-07-05 — End: 2018-07-05

## 2018-07-05 MED ORDER — PROMETHAZINE HCL 12.5 MG PO TABS
12.5 MG | Freq: Four times a day (QID) | ORAL | Status: DC | PRN
Start: 2018-07-05 — End: 2018-07-05

## 2018-07-05 MED ORDER — TIOTROPIUM BROMIDE MONOHYDRATE 2.5 MCG/ACT IN AERS
2.5 MCG/ACT | Freq: Every day | RESPIRATORY_TRACT | Status: DC
Start: 2018-07-05 — End: 2018-07-05
  Administered 2018-07-05: 18:00:00 2 via RESPIRATORY_TRACT

## 2018-07-05 MED ORDER — DOCUSATE SODIUM 100 MG PO CAPS
100 MG | Freq: Every day | ORAL | Status: DC
Start: 2018-07-05 — End: 2018-07-05

## 2018-07-05 MED ORDER — ALPRAZOLAM 0.25 MG PO TABS
0.25 MG | Freq: Three times a day (TID) | ORAL | Status: DC | PRN
Start: 2018-07-05 — End: 2018-07-05

## 2018-07-05 MED ORDER — ENOXAPARIN SODIUM 40 MG/0.4ML SC SOLN
40 | Freq: Every day | SUBCUTANEOUS | Status: DC
Start: 2018-07-05 — End: 2018-07-05
  Administered 2018-07-05: 13:00:00 40 mg via SUBCUTANEOUS

## 2018-07-05 MED ORDER — KETOROLAC TROMETHAMINE 30 MG/ML IJ SOLN
30 MG/ML | Freq: Four times a day (QID) | INTRAMUSCULAR | Status: DC | PRN
Start: 2018-07-05 — End: 2018-07-05

## 2018-07-05 MED FILL — KETOROLAC TROMETHAMINE 30 MG/ML IJ SOLN: 30 MG/ML | INTRAMUSCULAR | Qty: 1

## 2018-07-05 MED FILL — LOVENOX 40 MG/0.4ML SC SOLN: 40 MG/0.4ML | SUBCUTANEOUS | Qty: 0.4

## 2018-07-05 MED FILL — HYDROMORPHONE HCL 1 MG/ML IJ SOLN: 1 MG/ML | INTRAMUSCULAR | Qty: 1

## 2018-07-05 MED FILL — SPIRIVA RESPIMAT 2.5 MCG/ACT IN AERS: 2.5 MCG/ACT | RESPIRATORY_TRACT | Qty: 4

## 2018-07-05 MED FILL — MONOJECT FLUSH SYRINGE 0.9 % IV SOLN: 0.9 % | INTRAVENOUS | Qty: 10

## 2018-07-05 MED FILL — MORPHINE SULFATE 2 MG/ML IJ SOLN: 2 mg/mL | INTRAMUSCULAR | Qty: 1

## 2018-07-05 NOTE — Progress Notes (Signed)
Pt signed AMA.

## 2018-07-05 NOTE — Other (Signed)
Patient is angry because he cant have his wife visit, he left the floor refused to sign ama form or let me take his tele off or iv out . Code brown called patient brought back upstairs he is in bed now, supervisor millie is going to talk to him/

## 2018-07-05 NOTE — Progress Notes (Signed)
Patient followed downstairs and down to the main lobby when he became upset that his wife could not visit. He put a towel around his face and got onto the elevators. Security stopped patient at the doors and redirected patient and told patient the safety reasons of why he was not allowed visitors. Patient then called Dr.Palmer and Dr. Rubye Oaks spoke to patient and also told the patient same thing that the security did. Patient was redirected back onto the elevator with this PCA and leaned over on the rail crying in the elevator stating that he was in pain and ripped his telemetry off. Patient then was escorted back to his room and he went back to the bed. Patient is still refusing his telemetry and nurse notified. Manager in to talk to patient.

## 2018-07-05 NOTE — Progress Notes (Signed)
Pulmonary and Critical Care Medicine  Consult Note  Encounter Date: 07/05/2018 11:51 AM    [name] is a 48 y.o. male  DOB: 1970-07-15  Requesting Provider: Janne Lab, MD    Reason for request: Lung nodule            HISTORY OF PRESENT ILLNESS:    Patient is 48 y.o. presents with shortness of breath, his main complaint now is pain right upper quadrants and right lower chest, radiates to his back, he is walking across the room, he reported shortness of breath for almost a month, and worsening, he has cough however it hurts a lot he has been coughing much, he cannot take a deep breath due to pain, no fever, no nausea or vomiting, there is subjective fever however none here, no worsening lower extremity edema, he is a heavy smoker 2-1/2 packs/day for 30 years, no family history of lung disease, he works as Clinical biochemist.  No history of trauma          Past Medical History:        Diagnosis Date   ??? Hypertension        Past Surgical History:    No past surgical history on file.    Social History:     reports that he has been smoking. He has never used smokeless tobacco. He reports previous alcohol use. He reports current drug use. Drug: Opiates .    Family History:   No family history on file.  No family history of lung disease  Allergies:  Patient has no known allergies.        MEDICATIONS during current hospitalization:    Continuous Infusions:    Scheduled Meds:  ??? sodium chloride flush  10 mL Intravenous 2 times per day   ??? enoxaparin  40 mg Subcutaneous Daily   ??? cefTRIAXone (ROCEPHIN) IV  1 g Intravenous Q24H   ??? azithromycin  500 mg Intravenous Q24H       PRN Meds:ipratropium-albuterol, HYDROmorphone, sodium chloride flush, acetaminophen **OR** acetaminophen, polyethylene glycol, promethazine **OR** ondansetron, ketorolac, ketorolac        REVIEW OF SYSTEMS:  ROS: 10 organs review of system is done including general, psychological, ENT, hematological, endocrine, respiratory, cardiovascular,  gastrointestinal, musculoskeletal, neurological,  allergy and Immunology is done and is otherwise negative.    PHYSICAL EXAM:    Vitals:  BP (!) 165/91    Pulse 77    Temp 99.3 ??F (37.4 ??C) (Oral)    Resp 16    Ht 6\' 3"  (1.905 m)    Wt 260 lb (117.9 kg)    SpO2 92%    BMI 32.50 kg/m??     General: alert, cooperative, mild distress  Head: normocephalic, atraumatic  Eyes:No gross abnormalities.  ENT:  MMM no lesions  Neck:  supple and no masses  Chest : clear to auscultation bilaterally- no wheezes, rales or rhonchi, normal air movement, no respiratory distress  Heart:: Heart sounds are normal.  Regular rate and rhythm without murmur, gallop or rub.  ABD: Tender right upper quadrant, no guarding or rebound, no organomegaly  Musculoskeletal : no cyanosis, no clubbing and no edema  Neuro:  Grossly normal  Skin: No rashes or nodules noted.  Lymph node:  no cervical nodes  Urology: No Foley   Psychiatric: anxious and in pain        Data Review  Recent Labs     07/03/18  1245 07/04/18  1845 07/04/18  1909  WBC 14.4* 17.6*  --    HGB 12.7* 13.7* 13.4*   HCT 38.7* 41.0*  --    PLT 331 311  --       Recent Labs     07/03/18  1245 07/04/18  1830 07/04/18  1909   NA 139 134*  --    K 4.6 4.1  --    CL 102 101  --    CO2 26 25  --    BUN 18 9  --    CREATININE 0.69* 0.59* 0.7*   GLUCOSE 101* 118*  --        MV Settings:          ABGs:   Recent Labs     07/04/18  1909   PHART 7.478*   PCO2ART 36   PO2ART 55*   HCO3ART 26.5   BEART 3   O2SATART 91*   TCO2ART 28     O2 Device: Nasal cannula  O2 Flow Rate (L/min): 2 L/min  Lab Results   Component Value Date    LACTA 0.7 07/04/2018       Radiology  I personally reviewed imaging studies and CT chest shows right pleural effusion small in size, with adjacent atelectasis, tiny pulmonary nodules, no significant mediastinal lymphadenopathy.        Assessment, plan:   Patient is at risk due to    ?? Shortness of breath, has been going on for almost 1 month, could related to underlying  COPD  ?? Will need cardiac evaluation and PFT  ?? Severe pain right upper quadrant needs further evaluation  ?? Small right-sided pleural effusion could be reflective to intra-abdominal etiology  ?? Adjacent atelectasis to the pleural effusion, need to confirm resolution on follow-up CAT scan  ?? Tiny lung nodules needs follow-up in a patient with a smoker this can be done as outpatient  ?? Smoking    Recommendation  ?? CT abdomen and pelvis  ?? Pain control per primary team  ?? Incentive spirometry and flutter  ?? PFT when pain is better controlled can be done as outpatient  ?? Follow-up CAT scan in 6 to 8 weeks, to evaluate right lower lobe atelectatic changes and confirm resolution  ?? Lung nodules follow-up can be done with a CT scan in 6 months  ?? Smoking cessation is strongly recommended  ?? Start Spiriva           Thank you for consultation    Electronically signed by Earleen ReaperManaf Ziah Turvey, MD, FCCP,  on 07/05/2018 at 11:51 AM

## 2018-07-05 NOTE — Care Coordination-Inpatient (Signed)
CALLED PT IN ROOM, ACP COMPLETED.  WIFE AND FATHER GIVNE AS CONTACTS(PT UNSURE OF DADS NUMBER).  SPIRITUAL CARE CONSULTED FOR ADVANCED DIRECTIVES.  CONFIRMED PT DOES NOT HAVE INSURANCE, AWARE HELP DEPT WILL BE CALLING.  PT STATES HE HAS NOT SEEN [facility] IN OVER A YEAR-DR FLEMMING AND WOULD LIKE A PCP AROUND HERE, WILL NEED PCP LIST.

## 2018-07-05 NOTE — Progress Notes (Signed)
Frank Lawrence is a 48 y.o. male patient. Upon entering the room pt compalains of severe right flank pain, has history of kidney stones    Current Facility-Administered Medications   Medication Dose Route Frequency Provider Last Rate Last Dose   ??? ipratropium-albuterol (DUONEB) nebulizer solution 1 ampule  1 ampule Inhalation Q4H PRN Florence CannerYacoub Baghdy, MD       ??? HYDROmorphone (DILAUDID) injection 1 mg  1 mg Intravenous Q4H PRN Colon FlatteryAmir Subrena Devereux, MD   1 mg at 07/05/18 1158   ??? tiotropium (SPIRIVA RESPIMAT) 2.5 MCG/ACT inhaler 2 puff  2 puff Inhalation Daily Manaf Zaizafoun, MD       ??? sodium chloride flush 0.9 % injection 10 mL  10 mL Intravenous 2 times per day Florence CannerYacoub Baghdy, MD   10 mL at 07/04/18 2251   ??? sodium chloride flush 0.9 % injection 10 mL  10 mL Intravenous PRN Florence CannerYacoub Baghdy, MD       ??? acetaminophen (TYLENOL) tablet 650 mg  650 mg Oral Q6H PRN Florence CannerYacoub Baghdy, MD        Or   ??? acetaminophen (TYLENOL) suppository 650 mg  650 mg Rectal Q6H PRN Florence CannerYacoub Baghdy, MD       ??? polyethylene glycol (GLYCOLAX) packet 17 g  17 g Oral Daily PRN Florence CannerYacoub Baghdy, MD       ??? promethazine (PHENERGAN) tablet 12.5 mg  12.5 mg Oral Q6H PRN Florence CannerYacoub Baghdy, MD        Or   ??? ondansetron (ZOFRAN) injection 4 mg  4 mg Intravenous Q6H PRN Florence CannerYacoub Baghdy, MD       ??? enoxaparin (LOVENOX) injection 40 mg  40 mg Subcutaneous Daily Florence CannerYacoub Baghdy, MD   40 mg at 07/05/18 [id number]   ??? cefTRIAXone (ROCEPHIN) 1 g IVPB in 50 mL D5W minibag  1 g Intravenous Q24H Florence CannerYacoub Baghdy, MD       ??? azithromycin (ZITHROMAX) 500 mg in D5W 250ml addavial  500 mg Intravenous Q24H Florence CannerYacoub Baghdy, MD       ??? ketorolac (TORADOL) injection 15 mg  15 mg Intravenous Q6H PRN Florence CannerYacoub Baghdy, MD       ??? ketorolac (TORADOL) injection 15 mg  15 mg Intravenous Q6H PRN Florence CannerYacoub Baghdy, MD   15 mg at 07/05/18 0851     No Known Allergies  Active Problems:    SOB (shortness of breath)  Resolved Problems:    * No resolved hospital problems. *    Blood pressure (!) 165/91, pulse 77,  temperature 99.3 ??F (37.4 ??C), temperature source Oral, resp. rate 16, height 6\' 3"  (1.905 m), weight 260 lb (117.9 kg), SpO2 92 %.    Subjective:  Symptoms:  He reports malaise and weakness.  No shortness of breath, cough, chest pain, headache, chest pressure, anorexia, diarrhea or anxiety.    Diet:  No nausea or vomiting.    Pain:  He complains of pain that is severe.      Objective:  General Appearance:  Ill-appearing.    Vital signs: (most recent): Blood pressure (!) 165/91, pulse 77, temperature 99.3 ??F (37.4 ??C), temperature source Oral, resp. rate 16, height 6\' 3"  (1.905 m), weight 260 lb (117.9 kg), SpO2 92 %.    HEENT: Normal HEENT exam.    Lungs:  Normal effort.    Heart: Normal rate.  Regular rhythm.  S1 normal.    Abdomen: Abdomen is soft.  Bowel sounds are normal.   There is no epigastric area tenderness.  Pulses: Distal pulses are intact.    Neurological: Patient is alert and oriented to person, place and time.    Pupils:  Pupils are equal, round, and reactive to light.    Skin:  Warm.      Assessment & Plan  1) right pleural effusion and right flank pain   CT abd  Pain control  2) COVID rule out  ID eval  2 test negative  3) pulmonary nodules and ? Malignancy  Oncology eval  4) tobacco use counseling given    Janne Lab, MD  07/05/2018

## 2018-07-05 NOTE — Progress Notes (Signed)
Pt came to desk, dressed and wanting to leave. Iv taken out, pt signed ama paper, supervisor notified.

## 2018-07-05 NOTE — Consults (Signed)
[facility] unit fifth floor//rule out COVID-19 unit    SERVICE DATE:  07/05/2018   SERVICE TIME:  7:39 PM  Admission date 07/04/2018  REASON FORCONSULT: Severe intractable chest wall pain and back pain  REQUESTING PHYSICIAN: Hospital team Janne Lab, MD  [place]    Chief Complaint   Patient presents with   . Shortness of Breath       HISTORY OF PRESENTILLNESS:  Mr. Ehresman is a 48 y.o. male who presents for severe intractable chest wall pain, shortness of breath, came through the emergency room at [facility].  Patient was admitted to the fifth floor for COVID-19 rule out unit due to findings as listed below on his imaging to the lung.  Initial thoughts was due to pulmonary embolism however found large pleural effusion, lymphadenopathy, pneumonia, high suspicion for underlying metastatic process versus highly infectious process.    Patient seen by infectious disease, started antibiotic treatment, order ESR and CRP which came highly high as noted below.    Patient came earlier and has increasing pain, is having tough time managing the pain, in spite of Dilaudid and morphine, he becomes agitated, left the floor several times, having very aggressive and angry behaviors throughout the whole day due to this pain.    As I came to see the patient I found that the patient left the floor initially however they called code brown on the patient and they brought him back as he went to meet his wife as refused to bring his wife up because he is a COVID-19 unit.    Patient was very sleepy after he came in in the spite he did not receive any pain medication.    I have reviewed his drug screen and on admission through the ER he was positive for cocaine, benzodiazepine, questionable opioids which could be nonprescribed since his [place] automated prescription reporting system [employer] not showing an opioid prescription.,  Also patient was not given opioids before his  drug screen.    He has a history of heroin abuse, mainly high suspicion of recent heroin use.    However the fact that the patient is in severe pain and agitation and anxiety at this time    His CAT scan done highly suspicious to me for a compression fracture at T11-T12 which in light of what I see needs to rule out other infectious process      PAIN  ASSESSMENT:    constant, waxing and waning, excruciating    aching, pulsating, sharp, shooting, stabbing, throbbing and tight band    pain is excruciating , incapacitating and unbearable (9-10 pain scale)    PAST MEDICAL HISTORY:    Past Medical History:   Diagnosis Date   . Hypertension      PAST SURGICAL HISTORY:  No past surgical history on file.  FAMILY HISTORY:  No family history on file.  SOCIALHISTORY:    Social History     Socioeconomic History   . Marital status: Married     Spouse name: Not on file   . Number of children: Not on file   . Years of education: Not on file   . Highest education level: Not on file   Occupational History   . Not on file   Social Needs   . Financial resource strain: Not on file   . Food insecurity     Worry: Not on file  Inability: Not on file   . Transportation needs     Medical: Not on file     Non-medical: Not on file   Tobacco Use   . Smoking status: Current Every Day Smoker   . Smokeless tobacco: Never Used   Substance and Sexual Activity   . Alcohol use: Not Currently   . Drug use: Yes     Types: Opiates    . Sexual activity: Not on file   Lifestyle   . Physical activity     Days per week: Not on file     Minutes per session: Not on file   . Stress: Not on file   Relationships   . Social Product manager on phone: Not on file     Gets together: Not on file     Attends religious service: Not on file     Active member of club or organization: Not on file     Attends meetings of clubs or organizations: Not on file     Relationship status: Not on file   . Intimate partner violence     Fear of current or ex partner: Not on  file     Emotionally abused: Not on file     Physically abused: Not on file     Forced sexual activity: Not on file   Other Topics Concern   . Not on file   Social History Narrative   . Not on file     PSYCHOLOGICAL HISTORY: Severe anxiety, history of heroin abuse  MEDICATIONS:  Medications Prior to Admission: levoFLOXacin (LEVAQUIN) 750 MG tablet, Take 1 tablet by mouth daily for 5 days  '@IPMED' @    ALLERGIES:  Patient has no known allergies.    COMPLETE REVIEW OF SYSTEMS:  As noted in HPI, 12 point ROS reviewed and otherwise negative.  Review of Systems   Constitutional: Positive for activity change, appetite change, fatigue and unexpected weight change. Negative for chills, diaphoresis and fever.   Eyes: Negative.    Respiratory: Positive for cough, chest tightness, shortness of breath, wheezing and stridor. Negative for apnea and choking.    Cardiovascular: Negative.    Gastrointestinal: Positive for abdominal distention, abdominal pain and constipation. Negative for anal bleeding, blood in stool, diarrhea, nausea, rectal pain and vomiting.   Genitourinary: Negative.    Musculoskeletal: Positive for arthralgias, back pain, gait problem, joint swelling and myalgias. Negative for neck pain.   Skin: Positive for color change and pallor. Negative for rash and wound.        Flushing and wide spread skin [place]    Allergic/Immunologic: Negative.    Neurological: Positive for dizziness, tremors, weakness, light-headedness, numbness and headaches. Negative for seizures, syncope, facial asymmetry and speech difficulty.   Hematological: Negative for adenopathy. Does not bruise/bleed easily.   Psychiatric/Behavioral: Positive for agitation, behavioral problems, confusion, decreased concentration, dysphoric mood, hallucinations and sleep disturbance. Negative for self-injury and suicidal ideas. The patient is nervous/anxious and is hyperactive.          OBJECTIVE  PHYSICAL EXAM:  BP (!) 165/91   Pulse 77   Temp 99.3 F  (37.4 C) (Oral)   Resp 16   Ht '6\' 3"'  (1.905 m)   Wt 260 lb (117.9 kg)   SpO2 92%   BMI 32.50 kg/m   Body mass index is 32.5 kg/m.  CONSTITUTIONAL:  Patient seem to be very sleepy lethargic when I came to see him in the room however  stress due to the pain the whole day.  I received several calls through the On the report today he was very agitated angry perfect serve and nursing team and also through the hospital team.  EYES:  vision intact  ENT:  normocepalic, without obvious abnormality, atraumatic  NECK: Severely tender with any movement, questionable having meningeal signs  BACK: Extremely tender severe restricted range of motion, severe tenderness on the T11-T12 on palpation  LUNGS: Severely distressed breathing pattern, apnea noted as he was sleeping and taking breathing for me however his parameters seem to be within acceptable range.  CARDIOVASCULAR:  tachycardic with regular rhythm  ABDOMEN: Obese, soft nontender nondistended  MUSCULOSKELETAL: Extremely tender and achy throughout the whole joints, extremely covered tattoos all over  NEUROLOGIC: Seems to be stressed versus lethargic when I saw him, motor or sensory appears to be symmetrical upper lower extremity I do not see any neurological deficits  SKIN: Spread skin tattoos    DATA:   Diagnostic tests reviewed for today's visit:    All labs and imaging results reviewed.     Lab Results   Component Value Date    WBC 17.6 07/04/2018    HGB 13.4 07/04/2018    HCT 41.0 07/04/2018    MCV 89.4 07/04/2018    PLT 311 07/04/2018    NA 134 07/04/2018    K 4.1 07/04/2018    CL 101 07/04/2018    CO2 25 07/04/2018    BUN 9 07/04/2018    CREATININE 0.7 07/04/2018    CREATININE 0.59 07/04/2018    CALCIUM 9.7 07/04/2018    ALKPHOS 63 07/04/2018    ALT 8 07/04/2018    AST 10 07/04/2018    BILITOT 0.7 07/04/2018    LABALBU 4.0 07/04/2018       Ct Abdomen Pelvis W Wo Contrast Additional Contrast? Radiologist Recommendation    Result Date: 07/05/2018  EXAM: CT ABDOMEN  PELVIS W WO CONTRAST CLINICAL: severe abd pain TECHNIQUE: CT scans of the abdomen and pelvis were obtained Following oral and 100 cc of IV nonionic contrast material. All CT scans at this facility use dose modulation, iterative reconstruction, and/or weight based dosing when appropriate to reduce radiation dose to as low as reasonably achievable.    FINDINGS: There is infiltrate at the right base with associated air bronchograms. There is a significant right pleural effusion layering laterally and in the subpulmonic space. Pneumonia and parapneumonic effusion suspected. Minimal linear markings at  the left base have the appearance of atelectasis..       No focal lesion seen in the liver or spleen.       No focal lesions or inflammatory changes in/around the pancreas or lesser sac. The stomach is not optimally distended but grossly within normal limits. There are gallstones present. The gallbladder is not distended. There is no inflammation in the gallbladder fossa. The biliary tree is unremarkable.        No suspicious renal mass or obstructive uropathy or renal calculi. Small cyst midpole the right kidney less than a centimeter in diameter. No evidence of hydronephrosis.       Adrenal glands are unremarkable. No retroperitoneal adenopathy or fluid collections. No evidence of abdominal aortic aneurysm.       Small bowel is nondilated there is no inflammatory changes within the small bowel or mesentery. The appendix is normal in position retrocecal. No obstructing colonic lesion or colitis.       The pelvic viscera are intact. No mass  or inflammatory changes seen in the pelvis. No significant adenopathy along the pelvic sidewall.       The bony structures comprising the lower axial skeleton and pelvis intact.      IMPRESSION: PNEUMONIA AND MODERATE SIZE RIGHT PARAPNEUMONIC EFFUSION. NO ACUTE PROCESS IN THE ABDOMEN OR PELVIS. INCIDENTAL GALLSTONES.    Cta Chest W Wo  (pe Study)    Result Date: 07/03/2018  Patient MRN:  [id number] DOB: [birth year] Age:  11 years Order Date: 07/03/2018 12:45 PM Gender: Male Exam: CTA CHEST W WO CONTRAST. TECHNIQUE: Helical CTA was performed through the chest utilizing 100 cc of Isovue 300 intravenous contrast.  Images were obtained with bolus tracking in order to opacify the pulmonary arteries.  Thick section coronal MIP 3D  reconstructions were performed on a separate workstation. Number of Images: 433 Indication:   Right upper chest pain x4 weeks worsening over 2 days, rule out pulmonary embolism Comparison: Chest x-ray 07/03/2018 Contrast dosage: Isovue-300, 100 mL, IV Findings: This examination was performed on a CT scanner with dose reduction. Technique: Low-dose CT  acquisition technique included one of following options; 1 . Automated exposure control, 2. Adjustment of MA and or KV according to patient's size or 3. Use of iterative reconstruction. Thyroid unremarkable. There is no evidence of pulmonary embolism to the segmental level. More distal pulmonary emboli are not excluded. Greater than expected density in the right hilar region is suggestive 5 pulmonary digit left tissue. Left lung: 1. No infiltrate/pneumonia, pneumothorax or pleural effusion. 2. No discrete noncalcified pulmonary nodule or spiculated mass. Incomplete imaging of a nodular density in the pericaval region measuring 1.5 x 1.1 cm. Age-indeterminate anterior wedging/compression deformities noted T10-T12. Abnormal lymphadenopathy in the subcarinal region measuring 1.3 x 1.9 cm. Right lung: 1. Right pleural effusion with associated airspace disease. 2. Pulmonary nodule (series 3, image 42), measuring 0.67 cm. 3. Groundglass pulmonary nodule (series 3 image 34,), measuring approximately 0.72 cm. Left lung: 1. No infiltrate/pneumonia, pneumothorax or pleural effusion. 2. No discrete noncalcified pulmonary nodule or spiculated mass.     1. No CT evidence of pulmonary embolism to the segmental level. More distal pulmonary emboli are not  excluded. Abnormal density noted in the right hilar region surrounding the vessels, findings are suspected to reflect proliferation of lymphoid tissue/lymph nodes, I cannot exclude an infiltrative mass or malignancy. PET/CT is recommended. 2. There is a right pleural effusion with associated airspace disease in the right lung base that is likely reflective of a combination of infiltrate/pneumonia and atelectasis. 3. Pulmonary nodules are noted in the right lung, on series 3 image 42 series 3 image 34, measuring between 0.67 0.73 cm, nonspecific finding, findings can be seen in chronic pulmonary nodule formation or metastatic disease/malignancy. 4. Incomplete inclusion on the scan of a nodular density in the pericaval region is suspicious for abnormally enlarged lymphadenopathy measuring 1.5 x 1.1 cm. 5. Age-indeterminate anterior wedging/compression fracture deformities T10-T12 vertebral bodies.    Xr Chest Portable    Result Date: 07/03/2018  Patient MRN: [id number] DOB: [birth year] Age:  60 years Gender: Male Order Date: 07/03/2018 12:45 PM. Exam: XR CHEST PORTABLE Number of Views: 1 Indication:  Right-sided chest pain times a couple of days Comparison: None. Findings: Airspace disease in the right lung base. Airspace disease in the right upper lung. No pneumothorax. Cardiomediastinal silhouette within normal limits.     Impression:  1. Nonspecific airspace disease right upper lung and right lower lung base. No comparisons are available. Findings could reflect infiltrate/pneumonia.  I cannot exclude CoVid-19 viral infection and/or pulmonary involvement based on this study.. Other etiologies are not excluded.         ASSESSMENT & PLAN  Active Hospital Problems    Diagnosis Date Noted   . Acute chest wall pain [R07.89] 07/05/2018   . Pleuritic chest pain [R07.81] 07/05/2018   . Thoracic back pain [M54.6] 07/05/2018   . Compression fracture of T11 vertebra with delayed healing [S22.080G] 07/05/2018   . Compression fracture of  T12 vertebra ([place]) [S22.080A] 07/05/2018   . 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 Cocaine abuse ([place]) [F14.10] 07/05/2018   . High risk medication use [Z79.899] 07/05/2018   . Severe anxiety with panic [F41.0] 07/05/2018   . 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    Imaging as above showed picture of right lung pneumonia with pleural effusion, widespread lymphadenopathy highly suspicious for infectious versus malignant process.    CAT scan revealed T11-T12 compression fracture which patient denies any trauma.  Noted elevated ESR at 80, CRP at 291, drug screen highly suspicious for repeated heroin use, cocaine, multi-substance with benzodiazepine, I am highly concerned about his thoracic pain this could be either metastatic or osteomyelitis.  Given the severity of his pain I will be ordering MRI with and without contrast for the thoracic spine,    As patient was agitated the whole day he was only given Toradol at 3:00 however as he went down to met his wife he came up lethargic, I am concerned about if he could given something at that time however I will be ordering pain management to screen to identify what we are dealing with at this time.    Based on clinical evaluation the patient has high level of real true pain in his spine also severe pleuritic chest pain that need to be addressed and treated which can help decreasing his level of anxiety due to the recent work-up diagnosis that could be is highly infectious or metastatic cancer and this needs to be outlined by the primary team.      RECOMMENDATION:  SEE ORDERS    I will be starting tramadol p.o. every 6 hours 50 mg strength    Starting Dilaudid 1 mg IV every 3 hours    Xanax 0.5 oral p.o. 3  times daily as needed    Toradol 30 mg IV every 6 hours    Start Neurontin 300 mg p.o. 3 times a day to calm him down and decrease his pain level as an adjuvant therapy     Start Norflex 60 mg intramuscular 3 times daily    Topical analgesics to maximize on the back with Tylenol    MRI thoracic spine with and without contrast    We will reevaluate his response to treatment plan    SIGNATURE: Nani Ravens, MD PATIENT NAME: Gerrie Nordmann   DATE: July 05, 2018 MRN: [id number]   TIME: 7:39 PM PAGER/CONTACT #: [phone]

## 2018-07-05 NOTE — Progress Notes (Signed)
[facility] Respiratory Therapy Evaluation   Current Order:  DUONEB Q4 W/A    Home Regimen: NONE      Ordering Physician: Bari Edward  Re-evaluation Date:  6/11     Diagnosis: RESP FAILURE      Patient Status: Stable / Unstable + Physician notified    The following MDI Criteria must be met in order to convert aerosol to MDI with spacer. If unable to meet, MDI will be converted to aerosol:  '[]'$   Patient able to demonstrate the ability to use MDI effectively  '[]'$   Patient alert and cooperative  '[]'$   Patient able to take deep breath with 5-10 second hold  '[]'$   Medication(s) available in this delivery method   '[]'$   Peak flow greater than or equal to 200 ml/min            Current Order Substituted To  (same drug, same frequency)   Aerosol to MDI '[]'$  Albuterol Sulfate 0.083% unit dose by aerosol Albuterol Sulfate MDI 2 puffs by inhalation with spacer    '[]'$  Levalbuterol 1.25 mg unit dose by aerosol Levalbuterol MDI 2 puffs by inhalation with spacer    '[]'$  Levalbuterol 0.63 mg unit dose by aerosol Levalbuterol MDI 2 puffs by inhalation with spacer    '[]'$  Ipratropium Bromide 0.02% unit dose by aerosol Ipratropium Bromide MDI 2 puffs by inhalation with spacer    '[]'$  Duoneb (Ipratropium + Albuterol) unit dose by aerosol Ipratropium MDI + Albuterol MDI 2 puffs by inhalation w/spacer   MDI to Aerosol '[]'$  Albuterol Sulfate MDI Albuterol Sulfate 0.083% unit dose by aerosol    '[]'$  Levalbuterol MDI 2 puffs by inhalation Levalbuterol 1.25 mg unit dose by aerosol    '[]'$  Ipratropium Bromide MDI by inhalation Ipratropium Bromide 0.02% unit dose by aerosol    '[]'$  Combivent (Ipratropium + Albuterol) MDI by inhalation Duoneb (Ipratropium + Albuterol) unit dose by aerosol   Treatment Assessment [Frequency/Schedule]:  Change frequency to: _________DUONEB Q4 PRN_________________________________________per Protocol, P&T, MEC      Points 0 '1 2 3 4   '$ Pulmonary Status  Non-Smoker  '[]'$    Smoking history   < 20 pack years  '[]'$    Smoking history  ? 20 pack years  '[x]'$     Pulmonary Disorder  (acute or chronic)  '[]'$    Severe or Chronic w/ Exacerbation  '[]'$      Surgical Status No '[x]'$    Surgeries     General '[]'$    Surgery Lower '[]'$    Abdominal Thoracic or '[]'$    Upper Abdominal Thoracic with  PulmonaryDisorder  '[]'$      Chest X-ray Clear/Not  Ordered     '[]'$   Chronic Changes  Results Pending  '[]'$   Infiltrates, atelectasis, pleural effusion, or edema  '[x]'$   Infiltrates in more than one lobe '[]'$   Infiltrate + Atelectasis, &/or pleural effusion  '[]'$     Respiratory Pattern Regular,  RR = 12-20 '[]'$   Increased,  RR = 21-25 '[]'$   DOE, irregular,  or RR = 26-30 '[]'$   Decreased FEV1  or RR = 31-35 '[]'$   Severe SOB, use  of accessory muscles, or RR ? 35  '[]'$     Mental Status Alert, oriented,  Cooperative '[x]'$   Confused but Follows commands '[]'$   Lethargic or unable to follow commands '[]'$   Obtunded  '[]'$   Comatose  '[]'$     Breath Sounds Clear to  auscultation  '[]'$   Decreased unilaterally or  in bases only '[x]'$   Decreased  bilaterally  '[]'$   Crackles or  intermittent wheezes _0   Wheezes _1     Cough Strong, Spontan., & nonproductive _2   Strong,  spontaneous, &  productive _3   Weak,  Nonproductive _4   Weak, productive or  with wheezes _5   No spontaneous  cough or may require suctioning _6     Level of Activity Ambulatory _7   Ambulatory w/ Assist  _8   Non-ambulatory _9   Paraplegic _10   Quadriplegic _11     Total    Score:___5____     Triage Score:___5_____      Tri       Triage:     1. (>20) Freq: Q3    2. (16-20) Freq: Q4   3. (11-15) Freq: QID & Albuterol Q2 PRN    4. (6-10) Freq: TID & Albuterol Q2 PRN    5. (0-5) Freq Q4prn

## 2018-07-05 NOTE — ACP (Advance Care Planning) (Signed)
Advance Care Planning     Advance Care Planning Activator (Inpatient)  Conversation Note      Date of ACP Conversation: 07/04/2018    Conversation Conducted with: Patient with Decision Making Capacity    ACP Activator: [place] makes decisions on behalf of the incapacitated patient: Decision Maker is asked to consider and make decisions based on patient values, known preferences, or best interests.     Health Care Decision Maker:     Current Designated Health Care Decision Maker:   (If there is a [place] named in the "[place]" box in the ACP activity, but it is not visible above, be sure to open that field and then select the health care decision maker relationship (ie "primary") in the blank space to the right of the name.) Validate  this information as still accurate & up-to-date; edit [place] field as needed.)    Note: Assess and validate information in current ACP documents, as indicated.     If no Decision Maker listed above or available through scanned documents, then:    If no Authorized Decision Maker has previously been identified, then patient chooses [place]:  "Who would you like to name as your primary health care decision-maker?"               Name: robyn        Relationship: wife         Phone number:[phone]  "Can this person be reached easily?" Yes  "Who would you like to name as your back-up decision maker?"   Name: RANDY Sobolewski        Relationship: DAD        Phone number: PT UNSURE    Note: If the relationship of these Decision-Makers to the patient does NOT follow your state's Next of Kin hierarchy, recommend that patient complete ACP document that meets state-specific requirements to allow them to act on the patient's behalf when appropriate.    Care Preferences    Ventilation:  "If you were in your present state of health and suddenly became very ill and were unable to breathe on  your own, what would your preference be about the use of a ventilator (breathing machine) if it were available to you?"      Would the patient desire the use of ventilator (breathing machine)?: yes    "If your health worsens and it becomes clear that your chance of recovery is unlikely, what would your preference be about the use of a ventilator (breathing machine) if it were available to you?"     Would the patient desire the use of ventilator (breathing machine)?: No      Resuscitation  "CPR works best to restart the heart when there is a sudden event, like a heart attack, in someone who is otherwise healthy. Unfortunately, CPR does not typically restart the heart for people who have serious health conditions or who are very sick."    "In the event your heart stopped as a result of an underlying serious health condition, would you want attempts to be made to restart your heart (answer "yes" for attempt to resuscitate) or would you prefer a natural death (answer "no" for do not attempt to resuscitate)?" yes      NOTE: If the patient has a valid advance directive AND now provides care preference(s) that are inconsistent with that prior directive, advise the patient  to consider either: creating a new advance directive that complies with state-specific requirements; or, if that is not possible, orally revoking that prior directive in accordance with state-specific requirements, which must be documented in the EHR.     [x]  Yes   []  No   Educated Patient / Decision Maker regarding differences between Advance Directives and portable DNR orders.    Length of ACP Conversation in minutes:      Conversation Outcomes:  [x]  ACP discussion completed  []  Existing advance directive reviewed with patient; no changes to patient's previously recorded wishes  []  New Advance Directive completed  []  Portable Do Not Rescitate prepared for Provider review and signature  []  POLST/POST/MOLST/MOST prepared for Provider review and  signature      Follow-up plan:    [x]  Schedule follow-up conversation to continue planning  []  Referred individual to Provider for additional questions/concerns   []  Advised patient/agent/surrogate to review completed ACP document and update if needed with changes in condition, patient preferences or care setting    []  This note routed to one or more involved healthcare providers

## 2018-07-05 NOTE — Progress Notes (Signed)
Patient is alert and oriented x4, steady gait, up independently. 89-90 % on RA, 94 on 2L NC. Patient complaining of back pain and right sided chest pain, toradol given per orders. Pt currently sleeping, respirations are unlabored.

## 2018-07-05 NOTE — Consults (Signed)
Infectious Disease     Patient Name: Frank Lawrence  Date: 07/05/2018  Date of Birth: 06-28-1970  Medical Record Number: [id number]              History of Present Illness:      Presents with shortness of breath for 1 month gradually worsening especially the past few days pain with deep inspiration some cough not productive possible fevers no nausea vomiting abdominal pain    Patient has lost 30 pounds in 2 months    Primary complaint is back pain with CT scan showing a T10 T12 wedge compression fracture        COVID-19 testing yesterday negative    Procalcitonin normal  Creatinine normal    Lymphocyte count elevated 17,600 lymphocytes normal  Lactic acid normal        CT scan chest showed no PE  Density right hilar region possible infiltrate  Pulmonary nodules some lymphadenopathy  Right pleural effusion with some infiltrate    Review of Systems   Constitutional: Positive for fatigue and fever. Negative for chills and diaphoresis.   HENT: Negative.    Respiratory: Positive for cough and shortness of breath. Negative for chest tightness.    Cardiovascular: Positive for chest pain and leg swelling.   Gastrointestinal: Negative.    Endocrine: Negative.    Genitourinary: Negative.    Musculoskeletal: Negative.    Skin: Negative.    Neurological: Negative.    Hematological: Negative.        Review of Systems: All 14 review of systems negative other than as stated above    Social History     Tobacco Use   . Smoking status: Current Every Day Smoker   . Smokeless tobacco: Never Used   Substance Use Topics   . Alcohol use: Not Currently   . Drug use: Yes     Types: Opiates          Past Medical History:   Diagnosis Date   . Hypertension            No past surgical history on file.      No current facility-administered medications on file prior to encounter.      Current Outpatient Medications on File Prior to Encounter   Medication Sig Dispense Refill   . levoFLOXacin (LEVAQUIN) 750 MG tablet Take 1 tablet by mouth daily for  5 days 5 tablet 0       No Known Allergies      No family history on file.      Physical Exam:      Physical Exam   Constitutional: He is oriented to person, place, and time. No distress.   HENT:   Head: Normocephalic.   Eyes: Pupils are equal, round, and reactive to light.   Neck: Normal range of motion. Neck supple. No JVD present. No tracheal deviation present. No thyromegaly present.   Cardiovascular: Normal heart sounds.   No murmur heard.  Pulmonary/Chest: Effort normal and breath sounds normal. No respiratory distress. He has no wheezes. He has no rales. He exhibits no tenderness.   Planes of pain with deep inspiration pain is localized in the back   Abdominal: Bowel sounds are normal. He exhibits no distension and no mass. There is no abdominal tenderness. There is no rebound and no guarding.   Musculoskeletal:         General: No tenderness or edema.   Lymphadenopathy:     He has no cervical adenopathy.  Neurological: He is alert and oriented to person, place, and time.   Skin: Skin is warm and dry. No rash noted. He is not diaphoretic. No erythema. No pallor.       Blood pressure (!) 165/91, pulse 77, temperature 99.3 F (37.4 C), temperature source Oral, resp. rate 16, height 6\' 3"  (1.905 m), weight 260 lb (117.9 kg), SpO2 92 %.      .   Lab Results   Component Value Date    WBC 17.6 (H) 07/04/2018    HGB 13.4 (L) 07/04/2018    HCT 41.0 (L) 07/04/2018    MCV 89.4 07/04/2018    PLT 311 07/04/2018     Lab Results   Component Value Date    NA 134 07/04/2018    K 4.1 07/04/2018    CL 101 07/04/2018    CO2 25 07/04/2018    BUN 9 07/04/2018    CREATININE 0.7 07/04/2018    CREATININE 0.59 07/04/2018    GLUCOSE 118 07/04/2018    CALCIUM 9.7 07/04/2018                ASSESSMENT:  Patient Active Problem List   Diagnosis   . SOB (shortness of breath)         PLAN:  Right lower lobe pneumonia    Suspected COVID-19    Continue ceftriaxone and Zithromax    Repeat COVID-19 tomorrow maintain isolation until  then          Patient is out of covert isolation we will consider MRI of spine to evaluate for vertebral osteomyelitis    Sed rate CRP

## 2018-07-06 LAB — POCT VENOUS
GFR African American: >60 (ref >60)
GFR Non-African American: >60 (ref >60)
POC Creatinine: 0.7 mg/dL — ABNORMAL LOW (ref 0.9–1.3)

## 2018-07-10 LAB — CULTURE, BLOOD 1: Blood Culture, Routine: NO GROWTH

## 2018-07-10 LAB — CULTURE, BLOOD 2: Culture, Blood 2: NO GROWTH

## 2018-07-11 LAB — CULTURE, BLOOD 1: Blood Culture, Routine: NO GROWTH

## 2019-05-25 ENCOUNTER — Emergency Department (HOSPITAL_COMMUNITY): Payer: PRIVATE HEALTH INSURANCE

## 2019-05-25 ENCOUNTER — Emergency Department (HOSPITAL_COMMUNITY)
Admission: EM | Admit: 2019-05-25 | Discharge: 2019-05-25 | Disposition: A | Discharge status: Home / Self Care | Payer: PRIVATE HEALTH INSURANCE | Attending: Emergency Medicine | Admitting: Emergency Medicine

## 2019-05-25 ENCOUNTER — Encounter (HOSPITAL_COMMUNITY): Payer: Self-pay | Admitting: Emergency Medicine

## 2019-05-25 ENCOUNTER — Other Ambulatory Visit: Payer: Self-pay

## 2019-05-25 DIAGNOSIS — R0789 Other chest pain: Secondary | ICD-10-CM | POA: Insufficient documentation

## 2019-05-25 DIAGNOSIS — J40 Bronchitis, not specified as acute or chronic: Secondary | ICD-10-CM | POA: Diagnosis not present

## 2019-05-25 DIAGNOSIS — R079 Chest pain, unspecified: Secondary | ICD-10-CM

## 2019-05-25 DIAGNOSIS — R0602 Shortness of breath: Secondary | ICD-10-CM | POA: Diagnosis present

## 2019-05-25 DIAGNOSIS — Z20822 Contact with and (suspected) exposure to covid-19: Secondary | ICD-10-CM | POA: Diagnosis not present

## 2019-05-25 LAB — CBC
HCT: 48.3 % (ref 39.0–52.0)
Hemoglobin: 15.8 g/dL (ref 13.0–17.0)
MCH: 30.9 pg (ref 26.0–34.0)
MCHC: 32.7 g/dL (ref 30.0–36.0)
MCV: 94.5 fL (ref 80.0–100.0)
Platelets: 219 10*3/uL (ref 150–400)
RBC: 5.11 MIL/uL (ref 4.22–5.81)
RDW: 12.4 % (ref 11.5–15.5)
WBC: 12.5 10*3/uL — ABNORMAL HIGH (ref 4.0–10.5)
nRBC: 0 % (ref 0.0–0.2)

## 2019-05-25 LAB — BASIC METABOLIC PANEL
Anion gap: 11 (ref 5–15)
BUN: 12 mg/dL (ref 6–20)
CO2: 25 mmol/L (ref 22–32)
Calcium: 9.2 mg/dL (ref 8.9–10.3)
Chloride: 105 mmol/L (ref 98–111)
Creatinine, Ser: 0.76 mg/dL (ref 0.61–1.24)
GFR calc Af Amer: >60 mL/min (ref >60)
GFR calc non Af Amer: >60 mL/min (ref >60)
Glucose, Bld: 86 mg/dL (ref 70–99)
Potassium: 3.8 mmol/L (ref 3.5–5.1)
Sodium: 141 mmol/L (ref 135–145)

## 2019-05-25 LAB — RESPIRATORY PANEL BY RT PCR (FLU A&B, COVID)
Influenza A by PCR: NEGATIVE
Influenza B by PCR: NEGATIVE
SARS Coronavirus 2 by RT PCR: NEGATIVE

## 2019-05-25 LAB — TROPONIN I (HIGH SENSITIVITY)
Troponin I (High Sensitivity): 4 ng/L (ref <18)
Troponin I (High Sensitivity): 5 ng/L (ref <18)

## 2019-05-25 MED ORDER — PREDNISONE 10 MG PO TABS
20.0000 mg | ORAL_TABLET | Freq: Two times a day (BID) | ORAL | 0 refills | Status: AC
Start: 2019-05-25 — End: ?

## 2019-05-25 MED ORDER — LISINOPRIL 20 MG PO TABS
20.0000 mg | ORAL_TABLET | Freq: Every day | ORAL | 0 refills | Status: AC
Start: 2019-05-25 — End: ?

## 2019-05-25 MED ORDER — IOHEXOL 350 MG/ML SOLN
100.0000 mL | Freq: Once | INTRAVENOUS | Status: AC | PRN
  Administered 2019-05-25: 22:00:00 100 mL via INTRAVENOUS

## 2019-05-25 MED ORDER — AZITHROMYCIN 250 MG PO TABS: ORAL_TABLET | ORAL | 0 refills | Status: AC

## 2019-05-25 MED ORDER — ALBUTEROL SULFATE HFA 108 (90 BASE) MCG/ACT IN AERS
2.0000 | INHALATION_SPRAY | Freq: Once | RESPIRATORY_TRACT | Status: AC
  Administered 2019-05-25: 2 via RESPIRATORY_TRACT
  Filled 2019-05-25: qty 6.7

## 2019-05-25 MED ORDER — SODIUM CHLORIDE 0.9% FLUSH: 3.0000 mL | Freq: Once | INTRAVENOUS | Status: DC

## 2019-05-25 NOTE — ED Triage Notes (Signed)
Reports collapsed lung back in May 2020. Reports having chest and back pains and SOB.  Reports that had chest tubes pulled in November last year. Reports had covid had problems since. Had first vaccine and got sick afterwards so didn't get the second vaccine. Reports was exposed to covid that other week by a coworker.

## 2019-05-25 NOTE — ED Provider Notes (Signed)
[facility]-EMERGENCY DEPT Provider Note   CSN: [id number] Arrival date & time: 05/25/19  1551     History Chief Complaint  Patient presents with  . Shortness of Breath  . Chest Pain    Stuart Rosales is a 49 y.o. male.  Patient is a 49 year old male with past medical history of spontaneous pneumothorax, traumatic pneumothorax requiring chest tubes, both in 2020, and diagnosis of Covid pneumonia in December.  He presents with a several week history of worsening shortness of breath.  He describes feeling congested in his chest as well as right-sided pleuritic chest discomfort.  He reports occasional productive cough and wheezing.  He has been using his inhaler with minimal relief.  He denies fevers or chills.  He denies any leg swelling.  The history is provided by the patient.  Shortness of Breath Severity:  Moderate Onset quality:  Gradual Duration:  3 weeks Timing:  Constant Progression:  Worsening Chronicity:  New Context: activity and URI   Relieved by:  Nothing Worsened by:  Deep breathing and movement Ineffective treatments:  None tried      History reviewed. No pertinent past medical history.  There are no problems to display for this patient.   History reviewed. No pertinent surgical history.     No family history on file.  Social History   Tobacco Use  . Smoking status: Not on file  Substance Use Topics  . Alcohol use: Not on file  . Drug use: Not on file    Home Medications Prior to Admission medications   Medication Sig Start Date End Date Taking? Authorizing Provider  ibuprofen (ADVIL) 200 MG tablet Take 200 mg by mouth every 6 (six) hours as needed for moderate pain.   Yes [provider]    Allergies    Patient has no known allergies.  Review of Systems   Review of Systems  All other systems reviewed and are negative.   Physical Exam Updated Vital Signs BP (!) 158/101 (BP Location: Left Arm)   Pulse  74   Temp 99.3 F (37.4 C) (Oral)   Resp 18   SpO2 94%   Physical Exam Vitals and nursing note reviewed.  Constitutional:      General: He is not in acute distress.    Appearance: He is well-developed. He is not diaphoretic.  HENT:     Head: Normocephalic and atraumatic.  Cardiovascular:     Rate and Rhythm: Normal rate and regular rhythm.     Heart sounds: No murmur. No friction rub.  Pulmonary:     Effort: Pulmonary effort is normal. No respiratory distress.     Breath sounds: Normal breath sounds. No wheezing or rales.  Abdominal:     General: Bowel sounds are normal. There is no distension.     Palpations: Abdomen is soft.     Tenderness: There is no abdominal tenderness.  Musculoskeletal:        General: Normal range of motion.     Cervical back: Normal range of motion and neck supple.     Right lower leg: No tenderness. No edema.     Left lower leg: No tenderness. No edema.  Skin:    General: Skin is warm and dry.  Neurological:     Mental Status: He is alert and oriented to person, place, and time.     Coordination: Coordination normal.     ED Results / Procedures / Treatments   Labs (all labs ordered are  listed, but only abnormal results are displayed) Labs Reviewed  CBC - Abnormal; Notable for the following components:      Result Value   WBC 12.5 (*)    All other components within normal limits  RESPIRATORY PANEL BY RT PCR (FLU A&B, COVID)  BASIC METABOLIC PANEL  BRAIN NATRIURETIC PEPTIDE  TROPONIN I (HIGH SENSITIVITY)  TROPONIN I (HIGH SENSITIVITY)    EKG EKG Interpretation  Date/Time:  Wednesday May 25 2019 16:03:46 EDT Ventricular Rate:  89 PR Interval:    QRS Duration: 94 QT Interval:  349 QTC Calculation: 425 R Axis:   54 Text Interpretation: Sinus rhythm Abnormal R-wave progression, early transition 12 Lead; [place] No old tracing to compare Confirmed by Lacretia Leigh ([id number]) on 05/25/2019 6:54:18 PM   Radiology DG Chest 2  View  Result Date: 05/25/2019 CLINICAL DATA:  Acute chest pain and shortness of breath. History of pneumothorax 1 year ago. EXAM: CHEST - 2 VIEW COMPARISON:  None. FINDINGS: The cardiomediastinal silhouette is unremarkable. Mild peribronchial thickening is noted. There is no evidence of focal airspace disease, pulmonary edema, suspicious pulmonary nodule/mass, pleural effusion, or pneumothorax. No acute bony abnormalities are identified. IMPRESSION: Mild peribronchial thickening of uncertain chronicity. No other significant abnormalities. Electronically Signed   By: Margarette Canada M.D.   On: 05/25/2019 16:31    Procedures Procedures (including critical care time)  Medications Ordered in ED Medications  sodium chloride flush (NS) 0.9 % injection 3 mL (has no administration in time range)    ED Course  I have reviewed the triage vital signs and the nursing notes.  Pertinent labs & imaging results that were available during my care of the patient were reviewed by me and considered in my medical decision making (see chart for details).    MDM Rules/Calculators/A&P  Patient is a 49 year old male presenting with complaints of chest discomfort and shortness of breath as described in the HPI.  Patient has history of traumatic pneumothorax on the right.  His work-up today shows no evidence for a cardiac etiology.  His EKG is unchanged and troponin is negative.  Chest x-ray shows no acute process.  CT angio of the chest was obtained showing no evidence for pulmonary embolism or other acute pathology.  It did show calcifications of the coronary arteries which I suspect are an incidental finding.  At this point, I feel as though patient is appropriate for discharge.  He describes a several week history of chest congestion and cough for which he will be given prednisone, Zithromax, and a refill of his albuterol inhaler.  I will have him follow-up with cardiology regarding the coronary artery  calcifications which I again feel to be an incidental finding.  Final Clinical Impression(s) / ED Diagnoses Final diagnoses:  Chest pain    Rx / DC Orders ED Discharge Orders    None       Veryl Speak, MD 05/25/19 2252

## 2019-05-25 NOTE — Discharge Instructions (Addendum)
Begin taking Zithromax and prednisone as prescribed.  Use your albuterol inhaler, 2 puffs every 4 hours as needed for wheezing.  Follow-up with cardiology regarding the calcifications on your coronary arteries found by CT scan.  The contact information for the [facility] cardiology clinic has been provided in this discharge summary for you to call and make these arrangements.  Return to the emergency department in the meantime if symptoms significantly worsen or change.

## 2019-05-25 NOTE — ED Notes (Signed)
Iv attempted by this RN and on other RN for 20 above wrist for angio without success IV team consult placed.

## 2019-05-25 NOTE — ED Notes (Signed)
Iv team at bedside  

## 2021-07-19 IMAGING — CT CT ANGIO CHEST
2 of 6 series · 18 of 36 positions shown · IV contrast (omnipaque)
Comparison: None.

CLINICAL DATA: Chest and back pain with shortness of breath.

EXAM:
CT ANGIOGRAPHY CHEST WITH CONTRAST
TECHNIQUE: Multidetector CT imaging of the chest was performed using the
standard protocol during bolus administration of intravenous
contrast. Multiplanar CT image reconstructions and MIPs were
obtained to evaluate the vascular anatomy.
CONTRAST:  100mL OMNIPAQUE IOHEXOL 350 MG/ML SOLN

[Series 5: thins · axial · 0.84mm/px · z∈[+1313,+1625]mm · 17 of 352 slices shown]
[im 20/352  lung]
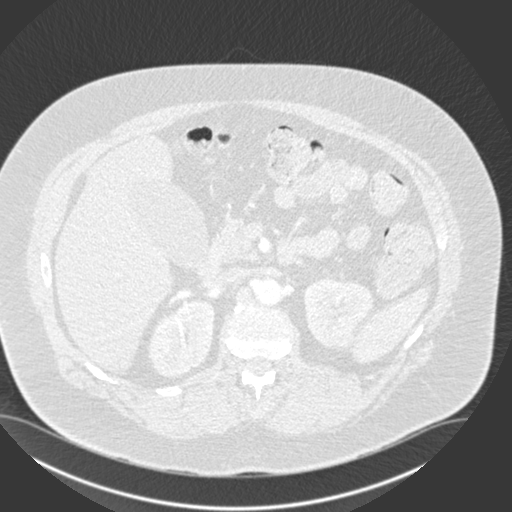
[im 40/352  mediastinal]
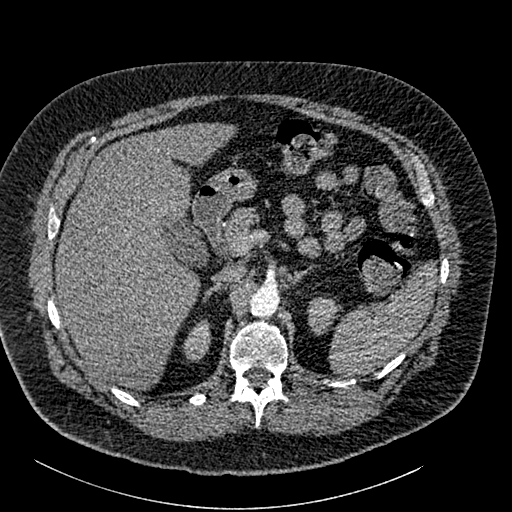
[im 59/352  lung]
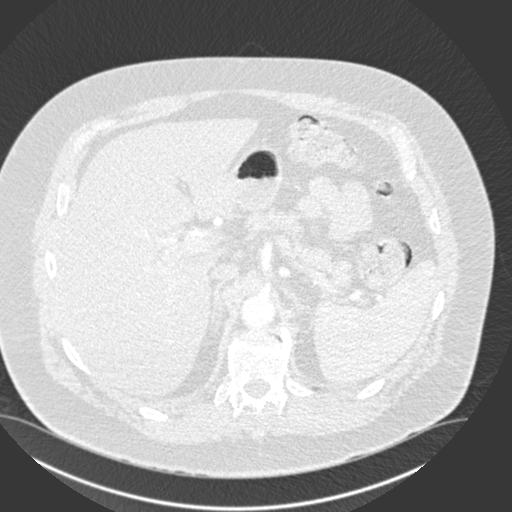
[im 79/352  mediastinal]
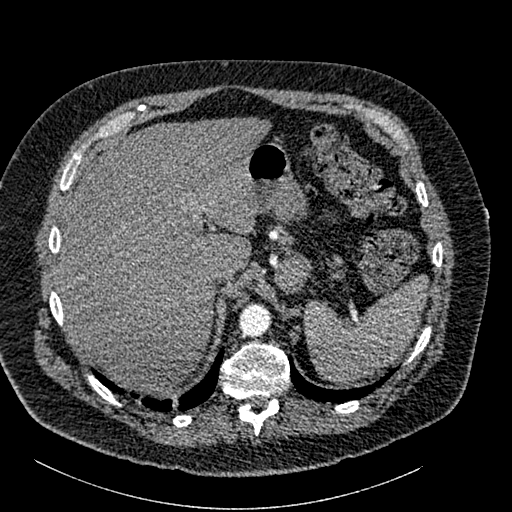
[im 98/352  lung]
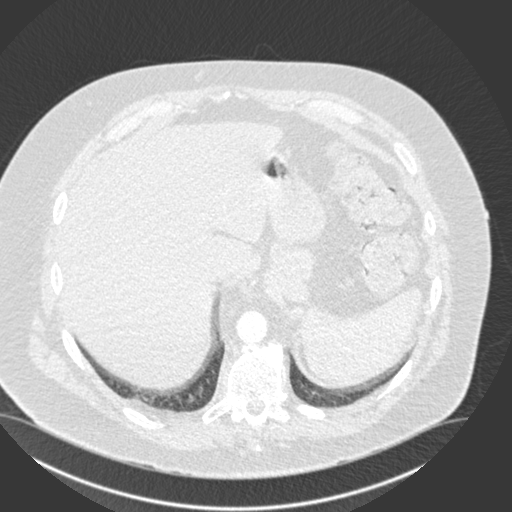
[im 118/352  mediastinal]
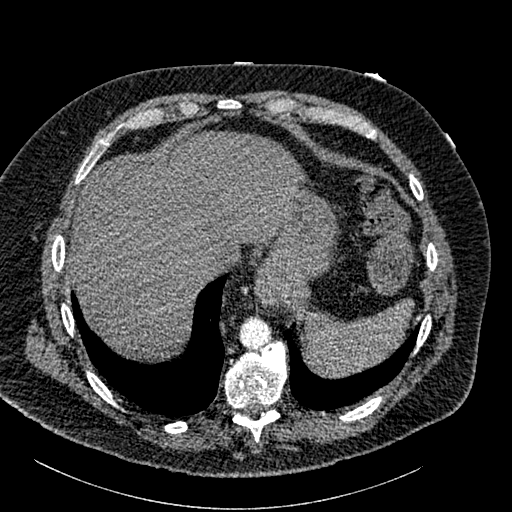
[im 137/352  lung]
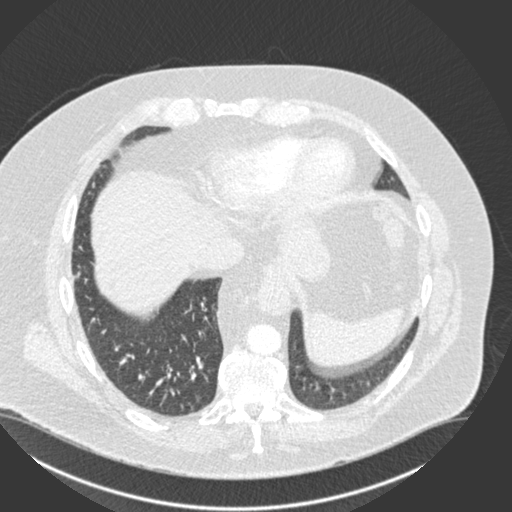
[im 157/352  mediastinal]
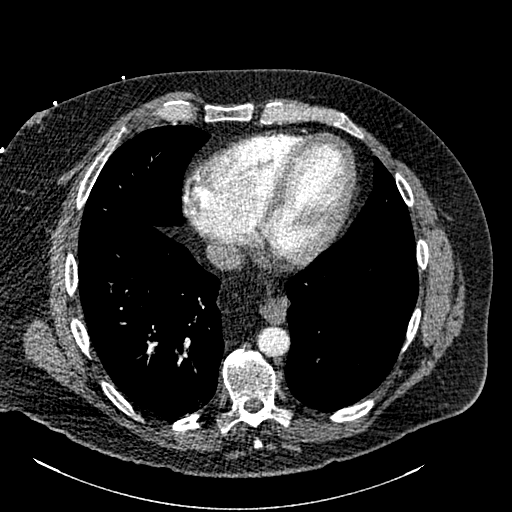
[im 176/352  lung]
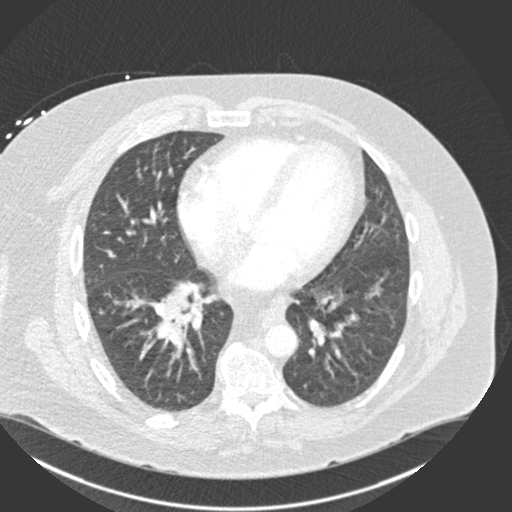
[im 196/352  mediastinal]
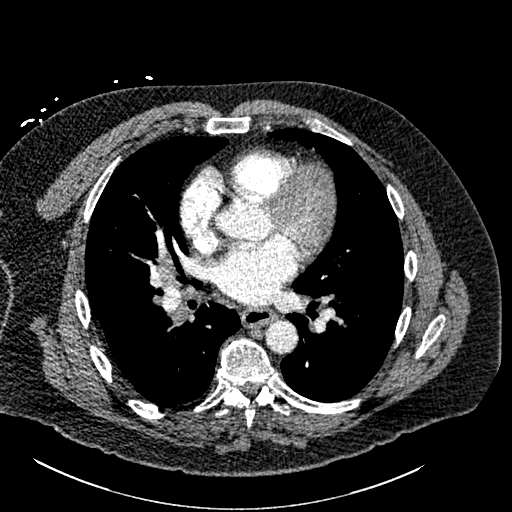
[im 215/352  lung]
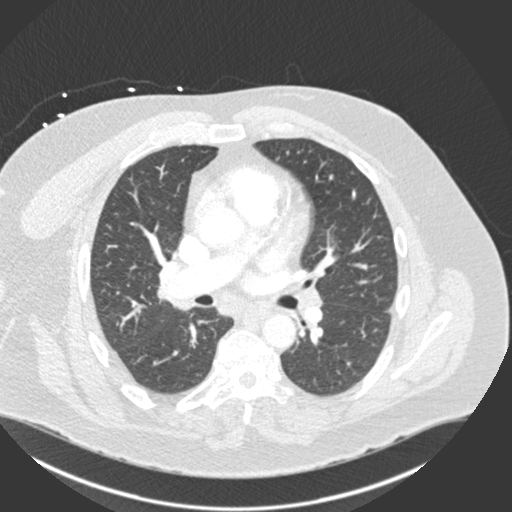
[im 235/352  mediastinal]
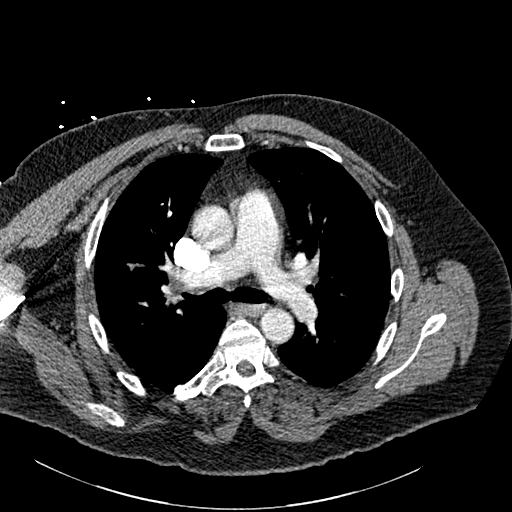
[im 254/352  lung]
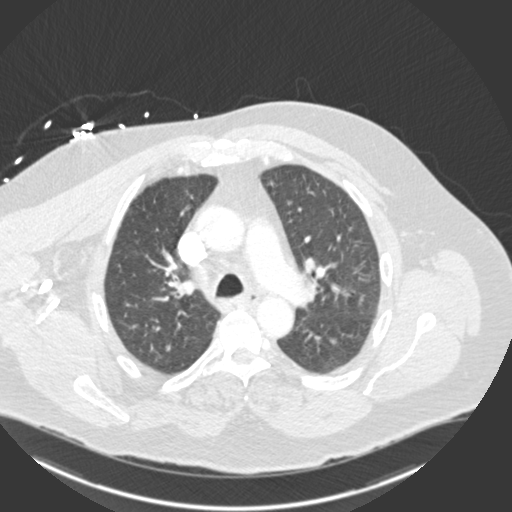
[im 274/352  mediastinal]
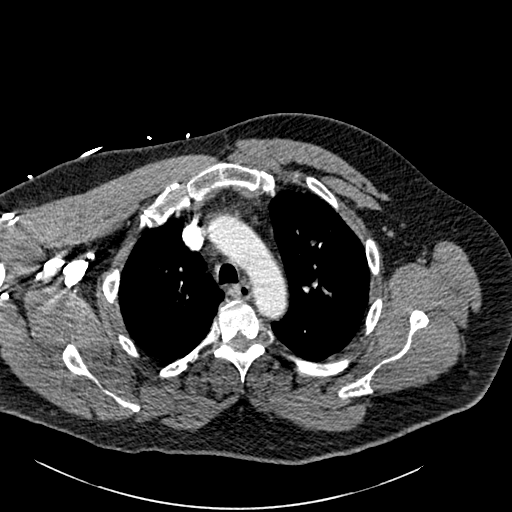
[im 293/352  lung]
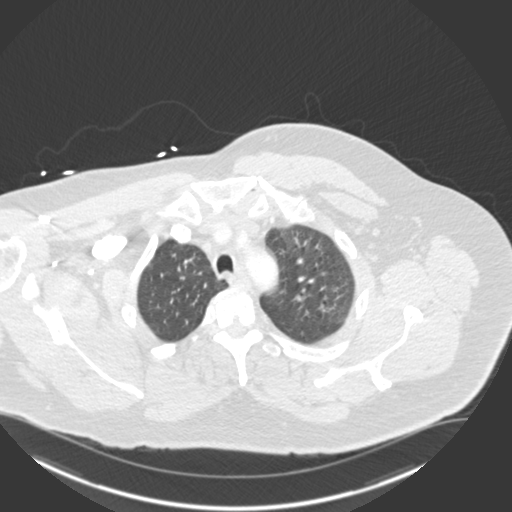
[im 313/352  mediastinal]
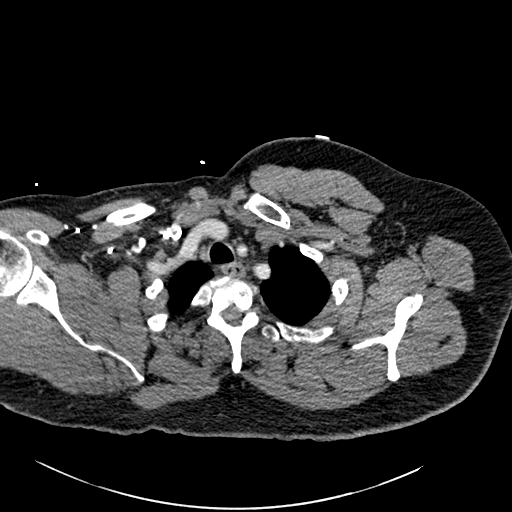
[im 332/352  lung]
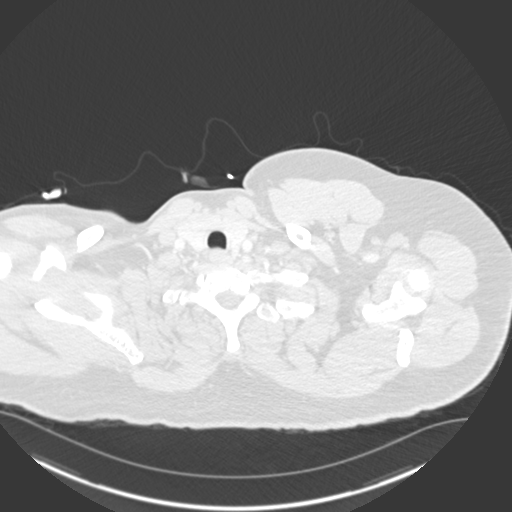

[Series 6: coronal mpr · coronal · 0.72mm/px · 1 of 182 slices shown]
[im 91/182  mediastinal]
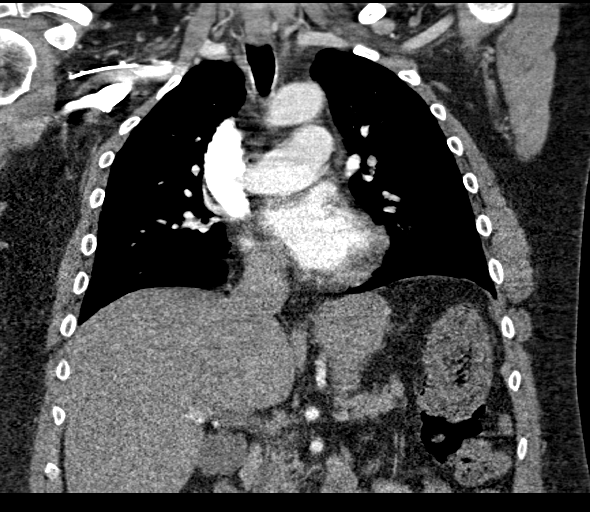

[18 of 36 positions shown; findings below may reference images not displayed]

FINDINGS: Cardiovascular: Satisfactory opacification of the pulmonary arteries
to the segmental level. No evidence of pulmonary embolism. Normal
heart size. No pericardial effusion. Marked severity coronary artery
calcification is seen.

Mediastinum/Nodes: There is mild pretracheal and left hilar
lymphadenopathy with mild to moderate severity right hilar
lymphadenopathy also noted.

Lungs/Pleura: A 5 mm noncalcified lung nodule is seen within the
right lower lobe (axial CT image 83, CT series number 10).
Additional 2 mm and 3 mm noncalcified lung nodules are seen along
the posterior and posterolateral aspects of the right lung base.

There is no evidence of acute infiltrate, pleural effusion or
pneumothorax.

Upper Abdomen: There is a small hiatal hernia.

Musculoskeletal: Mild multilevel degenerative changes seen
throughout the thoracic spine.

Review of the MIP images confirms the above findings.
IMPRESSION: 1. No evidence of acute pulmonary embolism.
2. Marked severity coronary artery calcification.
3. Small hiatal hernia.
4. Multiple subcentimeter right lower lobe noncalcified lung
nodules. Correlation with 12 month follow-up chest CT is recommended
to determine stability.
